# Patient Record
Sex: Female | Born: 1955 | Race: Black or African American | Hispanic: No | Marital: Married | Smoking: Former smoker
Health system: Southern US, Community
[De-identification: ages and names within clinical notes are randomized; demographics above are authoritative.]

## PROBLEM LIST (undated history)

## (undated) DIAGNOSIS — I10 Essential (primary) hypertension: Secondary | ICD-10-CM

## (undated) DIAGNOSIS — R809 Proteinuria, unspecified: Secondary | ICD-10-CM

## (undated) DIAGNOSIS — E785 Hyperlipidemia, unspecified: Secondary | ICD-10-CM

## (undated) DIAGNOSIS — E119 Type 2 diabetes mellitus without complications: Secondary | ICD-10-CM

## (undated) HISTORY — DX: Essential (primary) hypertension: I10

## (undated) HISTORY — DX: Type 2 diabetes mellitus without complications: E11.9

## (undated) HISTORY — DX: Hyperlipidemia, unspecified: E78.5

## (undated) HISTORY — DX: Proteinuria, unspecified: R80.9

---

## 2000-03-19 ENCOUNTER — Emergency Department (HOSPITAL_COMMUNITY): Admission: EM | Admit: 2000-03-19 | Discharge: 2000-03-19 | Payer: Self-pay | Admitting: Emergency Medicine

## 2002-03-15 ENCOUNTER — Other Ambulatory Visit: Admission: RE | Admit: 2002-03-15 | Discharge: 2002-03-15 | Payer: Self-pay | Admitting: Family Medicine

## 2003-02-09 ENCOUNTER — Encounter: Admission: RE | Admit: 2003-02-09 | Discharge: 2003-02-09 | Payer: Self-pay | Admitting: Family Medicine

## 2003-07-04 ENCOUNTER — Other Ambulatory Visit: Admission: RE | Admit: 2003-07-04 | Discharge: 2003-07-04 | Payer: Self-pay | Admitting: Family Medicine

## 2003-08-01 ENCOUNTER — Encounter: Admission: RE | Admit: 2003-08-01 | Discharge: 2003-09-19 | Payer: Self-pay | Admitting: Family Medicine

## 2004-12-31 ENCOUNTER — Other Ambulatory Visit: Admission: RE | Admit: 2004-12-31 | Discharge: 2004-12-31 | Payer: Self-pay | Admitting: Family Medicine

## 2007-01-06 ENCOUNTER — Other Ambulatory Visit: Admission: RE | Admit: 2007-01-06 | Discharge: 2007-01-06 | Payer: Self-pay | Admitting: Family Medicine

## 2009-08-14 ENCOUNTER — Other Ambulatory Visit: Admission: RE | Admit: 2009-08-14 | Discharge: 2009-08-14 | Payer: Self-pay | Admitting: Family Medicine

## 2012-06-22 ENCOUNTER — Other Ambulatory Visit: Payer: Self-pay | Admitting: Family Medicine

## 2012-06-22 ENCOUNTER — Other Ambulatory Visit (HOSPITAL_COMMUNITY)
Admission: RE | Admit: 2012-06-22 | Discharge: 2012-06-22 | Disposition: A | Discharge status: Home / Self Care | Payer: 59 | Source: Ambulatory Visit | Attending: Family Medicine | Admitting: Family Medicine

## 2012-06-22 DIAGNOSIS — Z124 Encounter for screening for malignant neoplasm of cervix: Secondary | ICD-10-CM | POA: Insufficient documentation

## 2012-12-02 ENCOUNTER — Ambulatory Visit: Payer: 59 | Admitting: *Deleted

## 2012-12-08 ENCOUNTER — Encounter: Payer: Self-pay | Admitting: *Deleted

## 2012-12-08 ENCOUNTER — Encounter: Payer: 59 | Attending: Family Medicine | Admitting: *Deleted

## 2012-12-08 VITALS — Ht 64.0 in | Wt 248.4 lb

## 2012-12-08 DIAGNOSIS — E119 Type 2 diabetes mellitus without complications: Secondary | ICD-10-CM

## 2012-12-08 DIAGNOSIS — Z713 Dietary counseling and surveillance: Secondary | ICD-10-CM | POA: Insufficient documentation

## 2012-12-08 NOTE — Progress Notes (Signed)
Appt start time: 1500 end time:  1600.   Insulin Instruction  Patient was seen on 12/08/12 for insulin instruction.  The following learning objectives were met by the patient during this visit:   Insulin Action of Levemir insulins  Reviewed syringe & vial VS pen including vial VS Pen cartridge and needles  Hygiene and storage  Drawing up single and mixed doses if using vials   Single dose   Mixed dose - NA   Rotation of Sites  Hypoglycemia- symptoms, causes , treatment choices  Record keeping and MD follow up  Hypoglycemia, causes, symptoms and treatment   Patient demonstrated understanding of insulin administration by return demonstration. Also provided information on:  Physiology of diabetes  SMBG, target ranges and trouble shooting tips, suggested he record her BG pre and post meal for MD review at next appt.  A1c  Carb Counting, label reading and rationale of spreading carb containing foods evenly throughout the day  Patient received the following handouts:  Insulin Instruction Handout Insulin Action handout Insulin Site and rotation handout                                       Plan: Patient to start on insulin as Rx'd by MD  Take 7 units Levemir at 8 PM each night. Increase dose by 2 units every 3-4 days until fasting BG is at 120 mg/dl, then stay at that dose as needed. Call MD if BG drops below 70 or rises above 300 mg/dl  Patient will be seen for follow-up as needed.

## 2012-12-10 ENCOUNTER — Encounter: Payer: Self-pay | Admitting: *Deleted

## 2012-12-10 NOTE — Patient Instructions (Signed)
Plan: Take 7 units Levemir at 8 PM each night. Increase dose by 2 units every 3-4 days until fasting BG is at 120 mg/dl, then stay at that dose as needed. Call MD if BG drops below 70 or rises above 300 mg/dl

## 2013-10-26 ENCOUNTER — Other Ambulatory Visit: Payer: Self-pay | Admitting: Family Medicine

## 2013-10-26 DIAGNOSIS — R921 Mammographic calcification found on diagnostic imaging of breast: Secondary | ICD-10-CM

## 2013-11-02 ENCOUNTER — Other Ambulatory Visit: Payer: 59

## 2013-11-05 ENCOUNTER — Encounter (INDEPENDENT_AMBULATORY_CARE_PROVIDER_SITE_OTHER): Payer: Self-pay

## 2013-11-05 ENCOUNTER — Ambulatory Visit
Admission: RE | Admit: 2013-11-05 | Discharge: 2013-11-05 | Disposition: A | Discharge status: Home / Self Care | Payer: 59 | Source: Ambulatory Visit | Attending: Family Medicine | Admitting: Family Medicine

## 2013-11-05 DIAGNOSIS — R921 Mammographic calcification found on diagnostic imaging of breast: Secondary | ICD-10-CM

## 2014-10-07 ENCOUNTER — Other Ambulatory Visit: Payer: Self-pay

## 2014-10-07 DIAGNOSIS — Z1231 Encounter for screening mammogram for malignant neoplasm of breast: Secondary | ICD-10-CM

## 2014-10-14 ENCOUNTER — Ambulatory Visit
Admission: RE | Admit: 2014-10-14 | Discharge: 2014-10-14 | Disposition: A | Discharge status: Home / Self Care | Payer: 59 | Source: Ambulatory Visit

## 2014-10-14 DIAGNOSIS — Z1231 Encounter for screening mammogram for malignant neoplasm of breast: Secondary | ICD-10-CM

## 2015-09-18 ENCOUNTER — Other Ambulatory Visit: Payer: Self-pay | Admitting: Family Medicine

## 2015-09-18 DIAGNOSIS — Z1231 Encounter for screening mammogram for malignant neoplasm of breast: Secondary | ICD-10-CM

## 2015-10-17 ENCOUNTER — Other Ambulatory Visit: Payer: Self-pay | Admitting: Family Medicine

## 2015-10-17 ENCOUNTER — Ambulatory Visit
Admission: RE | Admit: 2015-10-17 | Discharge: 2015-10-17 | Disposition: A | Discharge status: Home / Self Care | Payer: 59 | Source: Ambulatory Visit | Attending: Family Medicine | Admitting: Family Medicine

## 2015-10-17 ENCOUNTER — Other Ambulatory Visit (HOSPITAL_COMMUNITY)
Admission: RE | Admit: 2015-10-17 | Discharge: 2015-10-17 | Disposition: A | Discharge status: Home / Self Care | Payer: 59 | Source: Ambulatory Visit | Attending: Family Medicine | Admitting: Family Medicine

## 2015-10-17 DIAGNOSIS — Z124 Encounter for screening for malignant neoplasm of cervix: Secondary | ICD-10-CM | POA: Insufficient documentation

## 2015-10-17 DIAGNOSIS — Z1231 Encounter for screening mammogram for malignant neoplasm of breast: Secondary | ICD-10-CM

## 2015-10-19 LAB — CYTOLOGY - PAP

## 2016-09-13 ENCOUNTER — Other Ambulatory Visit: Payer: Self-pay | Admitting: Family Medicine

## 2016-09-13 DIAGNOSIS — Z1231 Encounter for screening mammogram for malignant neoplasm of breast: Secondary | ICD-10-CM

## 2016-11-15 ENCOUNTER — Ambulatory Visit
Admission: RE | Admit: 2016-11-15 | Discharge: 2016-11-15 | Disposition: A | Discharge status: Home / Self Care | Payer: 59 | Source: Ambulatory Visit | Attending: Family Medicine | Admitting: Family Medicine

## 2016-11-15 DIAGNOSIS — Z1231 Encounter for screening mammogram for malignant neoplasm of breast: Secondary | ICD-10-CM

## 2017-09-09 ENCOUNTER — Other Ambulatory Visit: Payer: Self-pay | Admitting: Family Medicine

## 2017-09-09 DIAGNOSIS — Z1231 Encounter for screening mammogram for malignant neoplasm of breast: Secondary | ICD-10-CM

## 2017-11-21 ENCOUNTER — Ambulatory Visit: Payer: 59

## 2017-11-24 ENCOUNTER — Other Ambulatory Visit (HOSPITAL_COMMUNITY): Payer: Self-pay | Admitting: *Deleted

## 2017-11-24 DIAGNOSIS — Z1231 Encounter for screening mammogram for malignant neoplasm of breast: Secondary | ICD-10-CM

## 2018-02-26 ENCOUNTER — Ambulatory Visit (HOSPITAL_COMMUNITY): Payer: Self-pay

## 2018-03-28 DIAGNOSIS — E119 Type 2 diabetes mellitus without complications: Secondary | ICD-10-CM | POA: Diagnosis not present

## 2018-03-28 DIAGNOSIS — J019 Acute sinusitis, unspecified: Secondary | ICD-10-CM | POA: Diagnosis not present

## 2018-04-13 DIAGNOSIS — J309 Allergic rhinitis, unspecified: Secondary | ICD-10-CM | POA: Diagnosis not present

## 2018-04-13 DIAGNOSIS — Z0000 Encounter for general adult medical examination without abnormal findings: Secondary | ICD-10-CM | POA: Diagnosis not present

## 2018-04-13 DIAGNOSIS — N181 Chronic kidney disease, stage 1: Secondary | ICD-10-CM | POA: Diagnosis not present

## 2018-04-13 DIAGNOSIS — E1121 Type 2 diabetes mellitus with diabetic nephropathy: Secondary | ICD-10-CM | POA: Diagnosis not present

## 2018-04-13 DIAGNOSIS — I129 Hypertensive chronic kidney disease with stage 1 through stage 4 chronic kidney disease, or unspecified chronic kidney disease: Secondary | ICD-10-CM | POA: Diagnosis not present

## 2018-09-05 DIAGNOSIS — H18212 Corneal edema secondary to contact lens, left eye: Secondary | ICD-10-CM | POA: Diagnosis not present

## 2018-10-12 DIAGNOSIS — N181 Chronic kidney disease, stage 1: Secondary | ICD-10-CM | POA: Diagnosis not present

## 2018-10-12 DIAGNOSIS — E7800 Pure hypercholesterolemia, unspecified: Secondary | ICD-10-CM | POA: Diagnosis not present

## 2018-10-12 DIAGNOSIS — I129 Hypertensive chronic kidney disease with stage 1 through stage 4 chronic kidney disease, or unspecified chronic kidney disease: Secondary | ICD-10-CM | POA: Diagnosis not present

## 2018-10-12 DIAGNOSIS — E1121 Type 2 diabetes mellitus with diabetic nephropathy: Secondary | ICD-10-CM | POA: Diagnosis not present

## 2018-12-14 DIAGNOSIS — Z1231 Encounter for screening mammogram for malignant neoplasm of breast: Secondary | ICD-10-CM | POA: Diagnosis not present

## 2019-04-22 ENCOUNTER — Other Ambulatory Visit: Payer: Self-pay | Admitting: Family Medicine

## 2019-04-22 ENCOUNTER — Other Ambulatory Visit (HOSPITAL_COMMUNITY)
Admission: RE | Admit: 2019-04-22 | Discharge: 2019-04-22 | Disposition: A | Discharge status: Home / Self Care | Payer: BC Managed Care – PPO | Source: Ambulatory Visit | Attending: Family Medicine | Admitting: Family Medicine

## 2019-04-22 DIAGNOSIS — Z124 Encounter for screening for malignant neoplasm of cervix: Secondary | ICD-10-CM | POA: Insufficient documentation

## 2019-04-22 DIAGNOSIS — Z0000 Encounter for general adult medical examination without abnormal findings: Secondary | ICD-10-CM | POA: Diagnosis not present

## 2019-04-22 DIAGNOSIS — I129 Hypertensive chronic kidney disease with stage 1 through stage 4 chronic kidney disease, or unspecified chronic kidney disease: Secondary | ICD-10-CM | POA: Diagnosis not present

## 2019-04-22 DIAGNOSIS — N181 Chronic kidney disease, stage 1: Secondary | ICD-10-CM | POA: Diagnosis not present

## 2019-04-22 DIAGNOSIS — E1121 Type 2 diabetes mellitus with diabetic nephropathy: Secondary | ICD-10-CM | POA: Diagnosis not present

## 2019-04-22 DIAGNOSIS — E7800 Pure hypercholesterolemia, unspecified: Secondary | ICD-10-CM | POA: Diagnosis not present

## 2019-04-22 DIAGNOSIS — H6123 Impacted cerumen, bilateral: Secondary | ICD-10-CM | POA: Diagnosis not present

## 2019-04-23 LAB — CYTOLOGY - PAP: Diagnosis: NEGATIVE

## 2019-05-11 DIAGNOSIS — M62830 Muscle spasm of back: Secondary | ICD-10-CM | POA: Diagnosis not present

## 2019-05-11 DIAGNOSIS — M545 Low back pain: Secondary | ICD-10-CM | POA: Diagnosis not present

## 2019-11-17 DIAGNOSIS — E1121 Type 2 diabetes mellitus with diabetic nephropathy: Secondary | ICD-10-CM | POA: Diagnosis not present

## 2019-11-17 DIAGNOSIS — N181 Chronic kidney disease, stage 1: Secondary | ICD-10-CM | POA: Diagnosis not present

## 2019-11-17 DIAGNOSIS — I129 Hypertensive chronic kidney disease with stage 1 through stage 4 chronic kidney disease, or unspecified chronic kidney disease: Secondary | ICD-10-CM | POA: Diagnosis not present

## 2019-11-17 DIAGNOSIS — E7800 Pure hypercholesterolemia, unspecified: Secondary | ICD-10-CM | POA: Diagnosis not present

## 2019-12-23 DIAGNOSIS — Z1231 Encounter for screening mammogram for malignant neoplasm of breast: Secondary | ICD-10-CM | POA: Diagnosis not present

## 2020-01-22 ENCOUNTER — Ambulatory Visit (HOSPITAL_COMMUNITY)
Admission: EM | Admit: 2020-01-22 | Discharge: 2020-01-22 | Disposition: A | Discharge status: Home / Self Care | Payer: BC Managed Care – PPO | Attending: Student | Admitting: Student

## 2020-01-22 ENCOUNTER — Encounter (HOSPITAL_COMMUNITY): Payer: Self-pay

## 2020-01-22 ENCOUNTER — Other Ambulatory Visit: Payer: Self-pay

## 2020-01-22 DIAGNOSIS — Z79899 Other long term (current) drug therapy: Secondary | ICD-10-CM | POA: Insufficient documentation

## 2020-01-22 DIAGNOSIS — R051 Acute cough: Secondary | ICD-10-CM | POA: Diagnosis not present

## 2020-01-22 DIAGNOSIS — Z87891 Personal history of nicotine dependence: Secondary | ICD-10-CM | POA: Diagnosis not present

## 2020-01-22 DIAGNOSIS — U071 COVID-19: Secondary | ICD-10-CM | POA: Diagnosis not present

## 2020-01-22 DIAGNOSIS — E119 Type 2 diabetes mellitus without complications: Secondary | ICD-10-CM | POA: Diagnosis not present

## 2020-01-22 DIAGNOSIS — I10 Essential (primary) hypertension: Secondary | ICD-10-CM | POA: Insufficient documentation

## 2020-01-22 DIAGNOSIS — J069 Acute upper respiratory infection, unspecified: Secondary | ICD-10-CM

## 2020-01-22 DIAGNOSIS — E785 Hyperlipidemia, unspecified: Secondary | ICD-10-CM | POA: Insufficient documentation

## 2020-01-22 DIAGNOSIS — Z794 Long term (current) use of insulin: Secondary | ICD-10-CM | POA: Diagnosis not present

## 2020-01-22 LAB — RESP PANEL BY RT-PCR (FLU A&B, COVID) ARPGX2
Influenza A by PCR: NEGATIVE
Influenza B by PCR: NEGATIVE
SARS Coronavirus 2 by RT PCR: POSITIVE — AB

## 2020-01-22 MED ORDER — BENZONATATE 100 MG PO CAPS: 100.0000 mg | ORAL_CAPSULE | Freq: Three times a day (TID) | ORAL | 0 refills | Status: AC

## 2020-01-22 MED ORDER — PROMETHAZINE-DM 6.25-15 MG/5ML PO SYRP: 5.0000 mL | ORAL_SOLUTION | Freq: Four times a day (QID) | ORAL | 0 refills | Status: AC | PRN

## 2020-01-22 MED ORDER — ALBUTEROL SULFATE HFA 108 (90 BASE) MCG/ACT IN AERS: 1.0000 | INHALATION_SPRAY | Freq: Four times a day (QID) | RESPIRATORY_TRACT | 0 refills | Status: AC | PRN

## 2020-01-22 NOTE — ED Triage Notes (Signed)
Pt presents with bilateral ear pain, body aches, scratchy throat, cough and fever 101.0 F x 2 days.

## 2020-01-22 NOTE — ED Provider Notes (Signed)
[facility]    CSN: [id number] Arrival date & time: 01/22/20  1308      History   Chief Complaint Chief Complaint  Patient presents with  . Cough  . Ear Pain  . Fever  . Generalized Body Aches    HPI Julie Cooke is a 65 y.o. female Presenting for URI symptoms for 2 days. History of diabetes, hyperlipidemia, hypertension, proteinuria. Endorses cough, ear pain, fevers up to 101.0, generalized body aches. States she typically gets sinus/ear pressure with colds and she's having that now. Hasn't been taking anything for symptoms. Endorses loss of taste. States she coughed so hard she threw up once this morning. 7/10 headache that's improved by ibuprofen. Denies n/d, shortness of breath, chest pain, facial pain, teeth pain, swollen lymph nodes. Denies chest pain, shortness of breath, confusion, high fevers.  Fully vaccinated for covid-19.  Denies worst headache of life, thunderclap headache, weakness/sensation changes in arms/legs, vision changes, shortness of breath, chest pain/pressure, photophobia, phonophobia, n/v/d.     HPI  Past Medical History:  Diagnosis Date  . Diabetes mellitus without complication (HCC)   . Hyperlipidemia   . Hypertension   . Proteinuria     There are no problems to display for this patient.   History reviewed. No pertinent surgical history.  OB History   No obstetric history on file.      Home Medications    Prior to Admission medications   Medication Sig Start Date End Date Taking? Authorizing Provider  albuterol (VENTOLIN HFA) 108 (90 Base) MCG/ACT inhaler Inhale 1-2 puffs into the lungs every 6 (six) hours as needed for wheezing or shortness of breath. 01/22/20  Yes Rhys Martini, PA-C  benzonatate (TESSALON) 100 MG capsule Take 1 capsule (100 mg total) by mouth every 8 (eight) hours. 01/22/20  Yes Rhys Martini, PA-C  promethazine-dextromethorphan (PROMETHAZINE-DM) 6.25-15 MG/5ML syrup Take 5 mLs by mouth 4 (four) times  daily as needed for cough. 01/22/20  Yes Rhys Martini, PA-C  benazepril (LOTENSIN) 20 MG tablet Take 20 mg by mouth daily.    [provider]  ferrous fumarate (FERRO-SEQUELS) 50 MG CR tablet Take 50 mg by mouth daily.    [provider]  hydrochlorothiazide (MICROZIDE) 12.5 MG capsule Take 12.5 mg by mouth daily.    [provider]  Insulin Detemir (LEVEMIR FLEXPEN) 100 UNIT/ML SOPN Inject 7 Units into the skin at bedtime.    [provider]  metFORMIN (GLUCOPHAGE-XR) 500 MG 24 hr tablet Take 500 mg by mouth 2 (two) times daily after a meal.    [provider]  Multiple Vitamin (MULTIVITAMIN) tablet Take 1 tablet by mouth daily.    [provider]    Family History History reviewed. No pertinent family history.  Social History Social History   Tobacco Use  . Smoking status: Former Smoker    Quit date: 12/09/1998    Years since quitting: 21.1  . Smokeless tobacco: Never Used  Substance Use Topics  . Alcohol use: No     Allergies   Patient has no known allergies.   Review of Systems Review of Systems  Constitutional: Positive for chills and fever. Negative for appetite change.  HENT: Positive for sinus pressure. Negative for congestion, ear pain, rhinorrhea, sinus pain and sore throat.   Eyes: Negative for redness and visual disturbance.  Respiratory: Positive for cough. Negative for chest tightness, shortness of breath and wheezing.   Cardiovascular: Negative for chest pain and palpitations.  Gastrointestinal: Negative for abdominal pain, constipation, diarrhea, nausea and vomiting.  Genitourinary: Negative for dysuria, frequency and urgency.  Musculoskeletal: Negative for myalgias.  Neurological: Positive for headaches. Negative for dizziness and weakness.  Psychiatric/Behavioral: Negative for confusion.  All other systems reviewed and are negative.    Physical Exam Triage Vital Signs ED Triage Vitals  Enc Vitals  Group     BP 01/22/20 1529 (!) 146/62     Pulse Rate 01/22/20 1529 84     Resp 01/22/20 1529 18     Temp 01/22/20 1529 98.1 F (36.7 C)     Temp Source 01/22/20 1529 Oral     SpO2 01/22/20 1529 99 %     Weight --      Height --      Head Circumference --      Peak Flow --      Pain Score 01/22/20 1528 5     Pain Loc --      Pain Edu? --      Excl. in GC? --    No data found.  Updated Vital Signs BP (!) 146/62 (BP Location: Right Arm)   Pulse 84   Temp 98.1 F (36.7 C) (Oral)   Resp 18   SpO2 99%   Visual Acuity Right Eye Distance:   Left Eye Distance:   Bilateral Distance:    Right Eye Near:   Left Eye Near:    Bilateral Near:     Physical Exam Vitals reviewed.  Constitutional:      General: She is not in acute distress.    Appearance: Normal appearance. She is not ill-appearing.  HENT:     Head: Normocephalic and atraumatic.     Right Ear: Hearing, tympanic membrane, ear canal and external ear normal. No swelling or tenderness. There is no impacted cerumen. No mastoid tenderness. Tympanic membrane is not perforated, erythematous, retracted or bulging.     Left Ear: Hearing, tympanic membrane, ear canal and external ear normal. No swelling or tenderness. There is no impacted cerumen. No mastoid tenderness. Tympanic membrane is not perforated, erythematous, retracted or bulging.     Nose:     Right Sinus: No maxillary sinus tenderness or frontal sinus tenderness.     Left Sinus: No maxillary sinus tenderness or frontal sinus tenderness.     Mouth/Throat:     Mouth: Mucous membranes are moist.     Pharynx: Uvula midline. No oropharyngeal exudate or posterior oropharyngeal erythema.     Tonsils: No tonsillar exudate.  Cardiovascular:     Rate and Rhythm: Normal rate and regular rhythm.     Heart sounds: Normal heart sounds.  Pulmonary:     Breath sounds: Normal breath sounds and air entry. No wheezing, rhonchi or rales.     Comments: Frequent coughs Chest:      Chest wall: No tenderness.  Abdominal:     General: Abdomen is flat. Bowel sounds are normal.     Tenderness: There is no abdominal tenderness. There is no guarding or rebound.  Lymphadenopathy:     Cervical: No cervical adenopathy.  Neurological:     General: No focal deficit present.     Mental Status: She is alert and oriented to person, place, and time.  Psychiatric:        Attention and Perception: Attention and perception normal.        Mood and Affect: Mood and affect normal.        Behavior: Behavior normal. Behavior is  cooperative.        Thought Content: Thought content normal.        Judgment: Judgment normal.      UC Treatments / Results  Labs (all labs ordered are listed, but only abnormal results are displayed) Labs Reviewed  RESP PANEL BY RT-PCR (FLU A&B, COVID) [id number]    EKG   Radiology No results found.  Procedures Procedures (including critical care time)  Medications Ordered in UC Medications - No data to display  Initial Impression / Assessment and Plan / UC Course  I have reviewed the triage vital signs and the nursing notes.  Pertinent labs & imaging results that were available during my care of the patient were reviewed by me and considered in my medical decision making (see chart for details).     Covid and influenza tests sent today- positive for covid. Patient is fully vaccinated for covid-19. Isolation precautions per CDC guidelines.. Symptomatic relief with OTC Mucinex, Nyquil, etc. Return precautions- new/worsening fevers/chills, shortness of breath, chest pain, abd pain, etc.   Albuterol, tessalon, promethazine DM as below.     Final Clinical Impressions(s) / UC Diagnoses   Final diagnoses:  Viral upper respiratory tract infection   Discharge Instructions   None    ED Prescriptions    Medication Sig Dispense Auth. Provider   benzonatate (TESSALON) 100 MG capsule Take 1 capsule (100 mg total) by mouth every 8 (eight) hours. 21  capsule Hazel Sams, PA-C   albuterol (VENTOLIN HFA) 108 (90 Base) MCG/ACT inhaler Inhale 1-2 puffs into the lungs every 6 (six) hours as needed for wheezing or shortness of breath. 1 each Hazel Sams, PA-C   promethazine-dextromethorphan (PROMETHAZINE-DM) 6.25-15 MG/5ML syrup Take 5 mLs by mouth 4 (four) times daily as needed for cough. 118 mL Hazel Sams, PA-C     PDMP not reviewed this encounter.   Hazel Sams, PA-C 01/23/20 [phone]

## 2020-03-29 DIAGNOSIS — N181 Chronic kidney disease, stage 1: Secondary | ICD-10-CM | POA: Diagnosis not present

## 2020-03-29 DIAGNOSIS — E1121 Type 2 diabetes mellitus with diabetic nephropathy: Secondary | ICD-10-CM | POA: Diagnosis not present

## 2020-03-29 DIAGNOSIS — E7800 Pure hypercholesterolemia, unspecified: Secondary | ICD-10-CM | POA: Diagnosis not present

## 2020-03-29 DIAGNOSIS — I129 Hypertensive chronic kidney disease with stage 1 through stage 4 chronic kidney disease, or unspecified chronic kidney disease: Secondary | ICD-10-CM | POA: Diagnosis not present

## 2020-04-28 DIAGNOSIS — E7800 Pure hypercholesterolemia, unspecified: Secondary | ICD-10-CM | POA: Diagnosis not present

## 2020-04-28 DIAGNOSIS — Z0000 Encounter for general adult medical examination without abnormal findings: Secondary | ICD-10-CM | POA: Diagnosis not present

## 2020-04-28 DIAGNOSIS — E1121 Type 2 diabetes mellitus with diabetic nephropathy: Secondary | ICD-10-CM | POA: Diagnosis not present

## 2020-04-28 DIAGNOSIS — I129 Hypertensive chronic kidney disease with stage 1 through stage 4 chronic kidney disease, or unspecified chronic kidney disease: Secondary | ICD-10-CM | POA: Diagnosis not present

## 2020-04-28 DIAGNOSIS — N181 Chronic kidney disease, stage 1: Secondary | ICD-10-CM | POA: Diagnosis not present

## 2020-05-03 DIAGNOSIS — Z780 Asymptomatic menopausal state: Secondary | ICD-10-CM | POA: Diagnosis not present

## 2020-09-18 DIAGNOSIS — L309 Dermatitis, unspecified: Secondary | ICD-10-CM | POA: Diagnosis not present

## 2020-10-04 DIAGNOSIS — R21 Rash and other nonspecific skin eruption: Secondary | ICD-10-CM | POA: Diagnosis not present

## 2020-11-01 DIAGNOSIS — E1121 Type 2 diabetes mellitus with diabetic nephropathy: Secondary | ICD-10-CM | POA: Diagnosis not present

## 2020-11-01 DIAGNOSIS — N181 Chronic kidney disease, stage 1: Secondary | ICD-10-CM | POA: Diagnosis not present

## 2020-11-01 DIAGNOSIS — I129 Hypertensive chronic kidney disease with stage 1 through stage 4 chronic kidney disease, or unspecified chronic kidney disease: Secondary | ICD-10-CM | POA: Diagnosis not present

## 2020-11-01 DIAGNOSIS — E7800 Pure hypercholesterolemia, unspecified: Secondary | ICD-10-CM | POA: Diagnosis not present

## 2020-12-08 ENCOUNTER — Encounter (HOSPITAL_COMMUNITY): Payer: Self-pay | Admitting: Emergency Medicine

## 2020-12-08 ENCOUNTER — Emergency Department (HOSPITAL_COMMUNITY)
Admission: EM | Admit: 2020-12-08 | Discharge: 2020-12-08 | Disposition: A | Discharge status: Home / Self Care | Payer: BC Managed Care – PPO | Attending: Emergency Medicine | Admitting: Emergency Medicine

## 2020-12-08 ENCOUNTER — Emergency Department (HOSPITAL_COMMUNITY): Payer: BC Managed Care – PPO

## 2020-12-08 ENCOUNTER — Other Ambulatory Visit: Payer: Self-pay

## 2020-12-08 DIAGNOSIS — R079 Chest pain, unspecified: Secondary | ICD-10-CM

## 2020-12-08 DIAGNOSIS — E119 Type 2 diabetes mellitus without complications: Secondary | ICD-10-CM | POA: Insufficient documentation

## 2020-12-08 DIAGNOSIS — R0789 Other chest pain: Secondary | ICD-10-CM | POA: Diagnosis not present

## 2020-12-08 DIAGNOSIS — Z794 Long term (current) use of insulin: Secondary | ICD-10-CM | POA: Insufficient documentation

## 2020-12-08 DIAGNOSIS — R9431 Abnormal electrocardiogram [ECG] [EKG]: Secondary | ICD-10-CM | POA: Diagnosis not present

## 2020-12-08 DIAGNOSIS — J9811 Atelectasis: Secondary | ICD-10-CM | POA: Diagnosis not present

## 2020-12-08 DIAGNOSIS — Z87891 Personal history of nicotine dependence: Secondary | ICD-10-CM | POA: Insufficient documentation

## 2020-12-08 DIAGNOSIS — Z79899 Other long term (current) drug therapy: Secondary | ICD-10-CM | POA: Insufficient documentation

## 2020-12-08 DIAGNOSIS — I10 Essential (primary) hypertension: Secondary | ICD-10-CM | POA: Diagnosis not present

## 2020-12-08 DIAGNOSIS — Z7984 Long term (current) use of oral hypoglycemic drugs: Secondary | ICD-10-CM | POA: Diagnosis not present

## 2020-12-08 DIAGNOSIS — R030 Elevated blood-pressure reading, without diagnosis of hypertension: Secondary | ICD-10-CM | POA: Diagnosis not present

## 2020-12-08 LAB — COMPREHENSIVE METABOLIC PANEL
ALT: 16 U/L (ref 0–44)
AST: 21 U/L (ref 15–41)
Albumin: 3.5 g/dL (ref 3.5–5.0)
Alkaline Phosphatase: 70 U/L (ref 38–126)
Anion gap: 8 (ref 5–15)
BUN: 10 mg/dL (ref 8–23)
CO2: 26 mmol/L (ref 22–32)
Calcium: 9.3 mg/dL (ref 8.9–10.3)
Chloride: 105 mmol/L (ref 98–111)
Creatinine, Ser: 0.73 mg/dL (ref 0.44–1.00)
GFR, Estimated: >60 mL/min (ref >60)
Glucose, Bld: 75 mg/dL (ref 70–99)
Potassium: 3.6 mmol/L (ref 3.5–5.1)
Sodium: 139 mmol/L (ref 135–145)
Total Bilirubin: 0.5 mg/dL (ref 0.3–1.2)
Total Protein: 8.2 g/dL — ABNORMAL HIGH (ref 6.5–8.1)

## 2020-12-08 LAB — CBC WITH DIFFERENTIAL/PLATELET
Abs Immature Granulocytes: 0 10*3/uL (ref 0.00–0.07)
Basophils Absolute: 0.2 10*3/uL — ABNORMAL HIGH (ref 0.0–0.1)
Basophils Relative: 2 %
Eosinophils Absolute: 0 10*3/uL (ref 0.0–0.5)
Eosinophils Relative: 0 %
HCT: 35.1 % — ABNORMAL LOW (ref 36.0–46.0)
Hemoglobin: 11.4 g/dL — ABNORMAL LOW (ref 12.0–15.0)
Lymphocytes Relative: 44 %
Lymphs Abs: 3.3 10*3/uL (ref 0.7–4.0)
MCH: 28.6 pg (ref 26.0–34.0)
MCHC: 32.5 g/dL (ref 30.0–36.0)
MCV: 88 fL (ref 80.0–100.0)
Monocytes Absolute: 0.8 10*3/uL (ref 0.1–1.0)
Monocytes Relative: 10 %
Neutro Abs: 3.3 10*3/uL (ref 1.7–7.7)
Neutrophils Relative %: 44 %
Platelets: 289 10*3/uL (ref 150–400)
RBC: 3.99 MIL/uL (ref 3.87–5.11)
RDW: 14.1 % (ref 11.5–15.5)
WBC: 7.5 10*3/uL (ref 4.0–10.5)
nRBC: 0 % (ref 0.0–0.2)
nRBC: 0 /100 WBC

## 2020-12-08 LAB — TROPONIN I (HIGH SENSITIVITY)
Troponin I (High Sensitivity): 9 ng/L (ref <18)
Troponin I (High Sensitivity): 6 ng/L (ref <18)

## 2020-12-08 NOTE — ED Provider Notes (Signed)
[facility] EMERGENCY DEPARTMENT Provider Note   CSN: [id number] Arrival date & time: 12/08/20  1314     History Chief Complaint  Patient presents with   Chest Pain    Julie Cooke is a 65 y.o. female.   Chest Pain Associated symptoms: no abdominal pain, no back pain, no shortness of breath and no weakness      Patient presents with left-sided chest pain.  Started in left arm yesterday.  Now more in the anterior chest.  Not exertional.  Worse with certain movements.  Worse with palpation.  Worse with a deep breath.  Does not feel short of breath.  She is diabetic.  High cholesterol hypertension.  No previous known cardiac history.  Remote smoker but quit 22 years ago.  No family history of early cardiac disease.  Pain is described as sharp.  Has been going constantly today.  No swelling in her legs.  No abdominal pain.  No diaphoresis.  Past Medical History:  Diagnosis Date   Diabetes mellitus without complication (HCC)    Hyperlipidemia    Hypertension    Proteinuria     There are no problems to display for this patient.   History reviewed. No pertinent surgical history.   OB History   No obstetric history on file.     No family history on file.  Social History   Tobacco Use   Smoking status: Former    Types: Cigarettes    Quit date: 12/09/1998    Years since quitting: 22.0   Smokeless tobacco: Never  Substance Use Topics   Alcohol use: No    Home Medications Prior to Admission medications   Medication Sig Start Date End Date Taking? Authorizing Provider  albuterol (VENTOLIN HFA) 108 (90 Base) MCG/ACT inhaler Inhale 1-2 puffs into the lungs every 6 (six) hours as needed for wheezing or shortness of breath. 01/22/20  Yes Rhys Martini, PA-C  benazepril (LOTENSIN) 20 MG tablet Take 20 mg by mouth daily.   Yes [provider]  ferrous fumarate (FERRO-SEQUELS) 50 MG CR tablet Take 50 mg by mouth daily.   Yes [provider]  hydrochlorothiazide (MICROZIDE) 12.5 MG capsule Take 12.5 mg by mouth daily.   Yes [provider]  insulin detemir (LEVEMIR) 100 UNIT/ML FlexPen Inject 54 Units into the skin at bedtime.   Yes [provider]  metFORMIN (GLUCOPHAGE-XR) 500 MG 24 hr tablet Take 500 mg by mouth 2 (two) times daily after a meal.   Yes [provider]  benzonatate (TESSALON) 100 MG capsule Take 1 capsule (100 mg total) by mouth every 8 (eight) hours. Patient not taking: Reported on 12/08/2020 01/22/20   Rhys Martini, PA-C  promethazine-dextromethorphan (PROMETHAZINE-DM) 6.25-15 MG/5ML syrup Take 5 mLs by mouth 4 (four) times daily as needed for cough. Patient not taking: Reported on 12/08/2020 01/22/20   Rhys Martini, PA-C    Allergies    Patient has no known allergies.  Review of Systems   Review of Systems  Constitutional:  Negative for appetite change.  HENT:  Negative for congestion.   Respiratory:  Negative for shortness of breath.   Cardiovascular:  Positive for chest pain.  Gastrointestinal:  Negative for abdominal pain.  Genitourinary:  Negative for enuresis.  Musculoskeletal:  Negative for back pain.  Skin:  Negative for rash.  Neurological:  Negative for weakness.  Psychiatric/Behavioral:  Negative for confusion.    Physical Exam Updated Vital Signs BP (!) 154/50  Pulse 63   Temp 97.9 F (36.6 C) (Oral)   Resp 18   Ht 5\' 4"  (1.626 m)   Wt 104.3 kg   SpO2 99%   BMI 39.48 kg/m   Physical Exam Vitals and nursing note reviewed.  Constitutional:      Appearance: She is well-developed.  Cardiovascular:     Rate and Rhythm: Normal rate and regular rhythm.     Heart sounds: No murmur heard. Chest:     Chest wall: Tenderness present.     Comments: Tenderness to left anterior lateral chest wall near shoulder.  No deformity.  No rash. Musculoskeletal:     Right lower leg: No tenderness. No edema.     Left lower leg: No tenderness. No edema.   Skin:    General: Skin is warm.     Capillary Refill: Capillary refill takes less than 2 seconds.  Neurological:     Mental Status: She is alert and oriented to person, place, and time.    ED Results / Procedures / Treatments   Labs (all labs ordered are listed, but only abnormal results are displayed) Labs Reviewed  COMPREHENSIVE METABOLIC PANEL - Abnormal; Notable for the following components:      Result Value   Total Protein 8.2 (*)    All other components within normal limits  CBC WITH DIFFERENTIAL/PLATELET - Abnormal; Notable for the following components:   Hemoglobin 11.4 (*)    HCT 35.1 (*)    Basophils Absolute 0.2 (*)    All other components within normal limits  TROPONIN I (HIGH SENSITIVITY)  TROPONIN I (HIGH SENSITIVITY)    EKG EKG Interpretation  Date/Time:  Friday December 08 2020 13:20:36 EST Ventricular Rate:  67 PR Interval:  180 QRS Duration: 76 QT Interval:  410 QTC Calculation: 433 R Axis:   63 Text Interpretation: Normal sinus rhythm Septal infarct , age undetermined T wave abnormality, consider inferolateral ischemia Abnormal ECG No old tracing to compare Confirmed by Aletta Edouard [id number]) on 12/08/2020 1:21:49 PM  Radiology DG Chest 2 View  Result Date: 12/08/2020 CLINICAL DATA:  Chest pain EXAM: CHEST - 2 VIEW COMPARISON:  None. FINDINGS: Heart size upper normal. Negative for heart failure. No infiltrate effusion or edema. Mild linear scarring or atelectasis in the lung bases. IMPRESSION: Mild bibasilar atelectasis scarring. Negative for infiltrate or effusion. Electronically Signed   By: Franchot Gallo M.D.   On: 12/08/2020 15:10    Procedures Procedures   Medications Ordered in ED Medications - No data to display  ED Course  I have reviewed the triage vital signs and the nursing notes.  Pertinent labs & imaging results that were available during my care of the patient were reviewed by me and considered in my medical decision making (see  chart for details).    MDM Rules/Calculators/A&P                      Patient with chest pain.  EKG shows inferior and lateral T wave inversion although we do not know what she looks like as the baseline.  Pain is been going on and off over the week.  Troponin negative x2.  Pain is reproducible with palpation.  Heart pathway score however is 4.  Troponin negative x2.  Discussed with patient we will discharge home.  Follow-up with cardiology. Final Clinical Impression(s) / ED Diagnoses Final diagnoses:  Nonspecific chest pain    Rx / DC Orders ED Discharge Orders  None        Davonna Belling, MD 12/09/20 [phone]

## 2020-12-08 NOTE — ED Triage Notes (Signed)
Patient BIB [facility] from [facility]. Complaint of cp, shortness of breath x2 days. Pt received aspirin at [facility].

## 2020-12-08 NOTE — ED Provider Notes (Signed)
Emergency Medicine Provider Triage Evaluation Note  Julie Cooke , a 65 y.o. female  was evaluated in triage.  Pt complains of chest pain.  Chest pain has been intermittent over the last week.  Patient reports that pain is located to her left chest and radiates to her left arm.  Pain became more intense today at approximately 10:00am.  Pain has continued to be intermittent.  Patient denies any aggravating factors.   Review of Systems  Positive: Chest pain Negative: Nausea, vomiting, diaphoresis, shortness of breath, palpitations, leg swelling or tenderness, hemoptysis  Physical Exam  BP (!) 182/76 (BP Location: Right Arm)   Pulse 70   Temp 97.9 F (36.6 C) (Oral)   Resp 16   Ht 5\' 4"  (1.626 m)   Wt 104.3 kg   SpO2 98%   BMI 39.48 kg/m  Gen:   Awake, no distress   Resp:  Normal effort, lungs clear to auscultation bilaterally MSK:   Moves extremities without difficulty  Other:  +2 radial pulse bilaterally.  Abdomen soft, nondistended, nontender.  Medical Decision Making  Medically screening exam initiated at 2:40 PM.  Appropriate orders placed.  Julie Cooke was informed that the remainder of the evaluation will be completed by another provider, this initial triage assessment does not replace that evaluation, and the importance of remaining in the ED until their evaluation is complete.     Kerin Salen, PA-C 12/08/20 1441    12/10/20, MD 12/08/20 [phone]

## 2020-12-20 DIAGNOSIS — L299 Pruritus, unspecified: Secondary | ICD-10-CM | POA: Diagnosis not present

## 2020-12-20 DIAGNOSIS — R21 Rash and other nonspecific skin eruption: Secondary | ICD-10-CM | POA: Diagnosis not present

## 2020-12-25 DIAGNOSIS — B029 Zoster without complications: Secondary | ICD-10-CM | POA: Diagnosis not present

## 2020-12-25 DIAGNOSIS — R21 Rash and other nonspecific skin eruption: Secondary | ICD-10-CM | POA: Diagnosis not present

## 2021-01-02 DIAGNOSIS — L309 Dermatitis, unspecified: Secondary | ICD-10-CM | POA: Diagnosis not present

## 2021-01-02 DIAGNOSIS — L259 Unspecified contact dermatitis, unspecified cause: Secondary | ICD-10-CM | POA: Diagnosis not present

## 2021-01-02 DIAGNOSIS — D485 Neoplasm of uncertain behavior of skin: Secondary | ICD-10-CM | POA: Diagnosis not present

## 2021-01-10 DIAGNOSIS — L2084 Intrinsic (allergic) eczema: Secondary | ICD-10-CM | POA: Diagnosis not present

## 2021-01-23 DIAGNOSIS — Z1211 Encounter for screening for malignant neoplasm of colon: Secondary | ICD-10-CM | POA: Diagnosis not present

## 2021-02-10 DIAGNOSIS — M25512 Pain in left shoulder: Secondary | ICD-10-CM | POA: Diagnosis not present

## 2021-02-10 DIAGNOSIS — M7522 Bicipital tendinitis, left shoulder: Secondary | ICD-10-CM | POA: Diagnosis not present

## 2021-02-15 DIAGNOSIS — Z03818 Encounter for observation for suspected exposure to other biological agents ruled out: Secondary | ICD-10-CM | POA: Diagnosis not present

## 2021-02-15 DIAGNOSIS — Z20822 Contact with and (suspected) exposure to covid-19: Secondary | ICD-10-CM | POA: Diagnosis not present

## 2021-02-28 DIAGNOSIS — L281 Prurigo nodularis: Secondary | ICD-10-CM | POA: Diagnosis not present

## 2021-05-16 DIAGNOSIS — L2089 Other atopic dermatitis: Secondary | ICD-10-CM | POA: Diagnosis not present

## 2021-05-16 DIAGNOSIS — L281 Prurigo nodularis: Secondary | ICD-10-CM | POA: Diagnosis not present

## 2021-05-16 DIAGNOSIS — R21 Rash and other nonspecific skin eruption: Secondary | ICD-10-CM | POA: Diagnosis not present

## 2021-05-16 DIAGNOSIS — D179 Benign lipomatous neoplasm, unspecified: Secondary | ICD-10-CM | POA: Diagnosis not present

## 2021-05-16 DIAGNOSIS — Z0000 Encounter for general adult medical examination without abnormal findings: Secondary | ICD-10-CM | POA: Diagnosis not present

## 2021-05-16 DIAGNOSIS — L249 Irritant contact dermatitis, unspecified cause: Secondary | ICD-10-CM | POA: Diagnosis not present

## 2021-05-16 DIAGNOSIS — E1121 Type 2 diabetes mellitus with diabetic nephropathy: Secondary | ICD-10-CM | POA: Diagnosis not present

## 2021-05-16 DIAGNOSIS — I129 Hypertensive chronic kidney disease with stage 1 through stage 4 chronic kidney disease, or unspecified chronic kidney disease: Secondary | ICD-10-CM | POA: Diagnosis not present

## 2021-06-20 DIAGNOSIS — L2089 Other atopic dermatitis: Secondary | ICD-10-CM | POA: Diagnosis not present

## 2021-06-20 DIAGNOSIS — L281 Prurigo nodularis: Secondary | ICD-10-CM | POA: Diagnosis not present

## 2021-06-20 DIAGNOSIS — L249 Irritant contact dermatitis, unspecified cause: Secondary | ICD-10-CM | POA: Diagnosis not present

## 2021-06-25 DIAGNOSIS — J0100 Acute maxillary sinusitis, unspecified: Secondary | ICD-10-CM | POA: Diagnosis not present

## 2021-06-25 DIAGNOSIS — R051 Acute cough: Secondary | ICD-10-CM | POA: Diagnosis not present

## 2021-07-04 DIAGNOSIS — L2089 Other atopic dermatitis: Secondary | ICD-10-CM | POA: Diagnosis not present

## 2021-07-05 DIAGNOSIS — R591 Generalized enlarged lymph nodes: Secondary | ICD-10-CM | POA: Diagnosis not present

## 2021-07-05 DIAGNOSIS — R059 Cough, unspecified: Secondary | ICD-10-CM | POA: Diagnosis not present

## 2021-07-05 DIAGNOSIS — J209 Acute bronchitis, unspecified: Secondary | ICD-10-CM | POA: Diagnosis not present

## 2021-07-05 DIAGNOSIS — R634 Abnormal weight loss: Secondary | ICD-10-CM | POA: Diagnosis not present

## 2021-07-06 ENCOUNTER — Other Ambulatory Visit: Payer: Self-pay | Admitting: Physician Assistant

## 2021-07-06 DIAGNOSIS — R059 Cough, unspecified: Secondary | ICD-10-CM

## 2021-07-06 DIAGNOSIS — R591 Generalized enlarged lymph nodes: Secondary | ICD-10-CM | POA: Diagnosis not present

## 2021-07-06 DIAGNOSIS — D72829 Elevated white blood cell count, unspecified: Secondary | ICD-10-CM | POA: Diagnosis not present

## 2021-07-06 DIAGNOSIS — R634 Abnormal weight loss: Secondary | ICD-10-CM | POA: Diagnosis not present

## 2021-08-15 DIAGNOSIS — R222 Localized swelling, mass and lump, trunk: Secondary | ICD-10-CM | POA: Diagnosis not present

## 2021-08-16 DIAGNOSIS — Z1231 Encounter for screening mammogram for malignant neoplasm of breast: Secondary | ICD-10-CM | POA: Diagnosis not present

## 2021-08-22 ENCOUNTER — Other Ambulatory Visit: Payer: Self-pay | Admitting: Surgery

## 2021-08-22 DIAGNOSIS — R222 Localized swelling, mass and lump, trunk: Secondary | ICD-10-CM

## 2021-08-29 DIAGNOSIS — R590 Localized enlarged lymph nodes: Secondary | ICD-10-CM | POA: Diagnosis not present

## 2021-08-29 DIAGNOSIS — L2089 Other atopic dermatitis: Secondary | ICD-10-CM | POA: Diagnosis not present

## 2021-09-05 ENCOUNTER — Ambulatory Visit
Admission: RE | Admit: 2021-09-05 | Discharge: 2021-09-05 | Disposition: A | Discharge status: Home / Self Care | Payer: Self-pay | Source: Ambulatory Visit | Attending: Surgery | Admitting: Surgery

## 2021-09-05 DIAGNOSIS — D47Z9 Other specified neoplasms of uncertain behavior of lymphoid, hematopoietic and related tissue: Secondary | ICD-10-CM | POA: Diagnosis not present

## 2021-09-05 DIAGNOSIS — R222 Localized swelling, mass and lump, trunk: Secondary | ICD-10-CM

## 2021-09-05 DIAGNOSIS — R590 Localized enlarged lymph nodes: Secondary | ICD-10-CM | POA: Diagnosis not present

## 2021-09-05 DIAGNOSIS — D479 Neoplasm of uncertain behavior of lymphoid, hematopoietic and related tissue, unspecified: Secondary | ICD-10-CM | POA: Diagnosis not present

## 2021-09-05 DIAGNOSIS — R599 Enlarged lymph nodes, unspecified: Secondary | ICD-10-CM | POA: Diagnosis not present

## 2021-09-14 DIAGNOSIS — Z79899 Other long term (current) drug therapy: Secondary | ICD-10-CM | POA: Diagnosis not present

## 2021-09-14 DIAGNOSIS — E119 Type 2 diabetes mellitus without complications: Secondary | ICD-10-CM | POA: Diagnosis not present

## 2021-09-14 DIAGNOSIS — D47Z9 Other specified neoplasms of uncertain behavior of lymphoid, hematopoietic and related tissue: Secondary | ICD-10-CM | POA: Diagnosis not present

## 2021-09-14 DIAGNOSIS — D479 Neoplasm of uncertain behavior of lymphoid, hematopoietic and related tissue, unspecified: Secondary | ICD-10-CM | POA: Diagnosis not present

## 2021-09-14 DIAGNOSIS — I10 Essential (primary) hypertension: Secondary | ICD-10-CM | POA: Diagnosis not present

## 2021-09-14 DIAGNOSIS — C8590 Non-Hodgkin lymphoma, unspecified, unspecified site: Secondary | ICD-10-CM | POA: Diagnosis not present

## 2021-09-19 DIAGNOSIS — D47Z9 Other specified neoplasms of uncertain behavior of lymphoid, hematopoietic and related tissue: Secondary | ICD-10-CM | POA: Diagnosis not present

## 2021-09-19 DIAGNOSIS — D739 Disease of spleen, unspecified: Secondary | ICD-10-CM | POA: Diagnosis not present

## 2021-09-19 DIAGNOSIS — E049 Nontoxic goiter, unspecified: Secondary | ICD-10-CM | POA: Diagnosis not present

## 2021-09-19 DIAGNOSIS — C8318 Mantle cell lymphoma, lymph nodes of multiple sites: Secondary | ICD-10-CM | POA: Diagnosis not present

## 2021-09-27 DIAGNOSIS — Z862 Personal history of diseases of the blood and blood-forming organs and certain disorders involving the immune mechanism: Secondary | ICD-10-CM | POA: Diagnosis not present

## 2021-09-27 DIAGNOSIS — E790 Hyperuricemia without signs of inflammatory arthritis and tophaceous disease: Secondary | ICD-10-CM | POA: Diagnosis not present

## 2021-09-27 DIAGNOSIS — E509 Vitamin A deficiency, unspecified: Secondary | ICD-10-CM | POA: Diagnosis not present

## 2021-09-27 DIAGNOSIS — D519 Vitamin B12 deficiency anemia, unspecified: Secondary | ICD-10-CM | POA: Diagnosis not present

## 2021-09-27 DIAGNOSIS — R7402 Elevation of levels of lactic acid dehydrogenase (LDH): Secondary | ICD-10-CM | POA: Diagnosis not present

## 2021-09-27 DIAGNOSIS — D472 Monoclonal gammopathy: Secondary | ICD-10-CM | POA: Diagnosis not present

## 2021-09-27 DIAGNOSIS — C9110 Chronic lymphocytic leukemia of B-cell type not having achieved remission: Secondary | ICD-10-CM | POA: Diagnosis not present

## 2021-10-02 DIAGNOSIS — R599 Enlarged lymph nodes, unspecified: Secondary | ICD-10-CM | POA: Diagnosis not present

## 2021-10-03 DIAGNOSIS — R778 Other specified abnormalities of plasma proteins: Secondary | ICD-10-CM | POA: Diagnosis not present

## 2021-10-03 DIAGNOSIS — D47Z9 Other specified neoplasms of uncertain behavior of lymphoid, hematopoietic and related tissue: Secondary | ICD-10-CM | POA: Diagnosis not present

## 2021-10-03 DIAGNOSIS — R768 Other specified abnormal immunological findings in serum: Secondary | ICD-10-CM | POA: Diagnosis not present

## 2021-10-04 DIAGNOSIS — R599 Enlarged lymph nodes, unspecified: Secondary | ICD-10-CM | POA: Diagnosis not present

## 2021-10-15 DIAGNOSIS — R778 Other specified abnormalities of plasma proteins: Secondary | ICD-10-CM | POA: Diagnosis not present

## 2021-10-15 DIAGNOSIS — R768 Other specified abnormal immunological findings in serum: Secondary | ICD-10-CM | POA: Diagnosis not present

## 2021-10-15 DIAGNOSIS — D47Z9 Other specified neoplasms of uncertain behavior of lymphoid, hematopoietic and related tissue: Secondary | ICD-10-CM | POA: Diagnosis not present

## 2021-10-18 DIAGNOSIS — R778 Other specified abnormalities of plasma proteins: Secondary | ICD-10-CM | POA: Diagnosis not present

## 2021-10-18 DIAGNOSIS — C9110 Chronic lymphocytic leukemia of B-cell type not having achieved remission: Secondary | ICD-10-CM | POA: Diagnosis not present

## 2021-10-18 DIAGNOSIS — D47Z9 Other specified neoplasms of uncertain behavior of lymphoid, hematopoietic and related tissue: Secondary | ICD-10-CM | POA: Diagnosis not present

## 2021-10-18 DIAGNOSIS — D649 Anemia, unspecified: Secondary | ICD-10-CM | POA: Diagnosis not present

## 2021-10-18 DIAGNOSIS — R768 Other specified abnormal immunological findings in serum: Secondary | ICD-10-CM | POA: Diagnosis not present

## 2021-10-24 DIAGNOSIS — C9110 Chronic lymphocytic leukemia of B-cell type not having achieved remission: Secondary | ICD-10-CM | POA: Diagnosis not present

## 2021-10-24 DIAGNOSIS — D630 Anemia in neoplastic disease: Secondary | ICD-10-CM | POA: Diagnosis not present

## 2021-10-31 DIAGNOSIS — C9110 Chronic lymphocytic leukemia of B-cell type not having achieved remission: Secondary | ICD-10-CM | POA: Diagnosis not present

## 2021-10-31 DIAGNOSIS — E1121 Type 2 diabetes mellitus with diabetic nephropathy: Secondary | ICD-10-CM | POA: Diagnosis not present

## 2021-10-31 DIAGNOSIS — E7800 Pure hypercholesterolemia, unspecified: Secondary | ICD-10-CM | POA: Diagnosis not present

## 2021-10-31 DIAGNOSIS — I129 Hypertensive chronic kidney disease with stage 1 through stage 4 chronic kidney disease, or unspecified chronic kidney disease: Secondary | ICD-10-CM | POA: Diagnosis not present

## 2021-11-06 DIAGNOSIS — Z5112 Encounter for antineoplastic immunotherapy: Secondary | ICD-10-CM | POA: Diagnosis not present

## 2021-11-06 DIAGNOSIS — C9110 Chronic lymphocytic leukemia of B-cell type not having achieved remission: Secondary | ICD-10-CM | POA: Diagnosis not present

## 2021-11-06 DIAGNOSIS — R0600 Dyspnea, unspecified: Secondary | ICD-10-CM | POA: Diagnosis not present

## 2021-11-07 DIAGNOSIS — Z5112 Encounter for antineoplastic immunotherapy: Secondary | ICD-10-CM | POA: Diagnosis not present

## 2021-11-07 DIAGNOSIS — C9110 Chronic lymphocytic leukemia of B-cell type not having achieved remission: Secondary | ICD-10-CM | POA: Diagnosis not present

## 2021-11-07 DIAGNOSIS — R222 Localized swelling, mass and lump, trunk: Secondary | ICD-10-CM | POA: Diagnosis not present

## 2021-11-08 DIAGNOSIS — C9110 Chronic lymphocytic leukemia of B-cell type not having achieved remission: Secondary | ICD-10-CM | POA: Diagnosis not present

## 2021-11-12 DIAGNOSIS — C9110 Chronic lymphocytic leukemia of B-cell type not having achieved remission: Secondary | ICD-10-CM | POA: Diagnosis not present

## 2021-11-14 DIAGNOSIS — Z5111 Encounter for antineoplastic chemotherapy: Secondary | ICD-10-CM | POA: Diagnosis not present

## 2021-11-14 DIAGNOSIS — D630 Anemia in neoplastic disease: Secondary | ICD-10-CM | POA: Diagnosis not present

## 2021-11-14 DIAGNOSIS — C9110 Chronic lymphocytic leukemia of B-cell type not having achieved remission: Secondary | ICD-10-CM | POA: Diagnosis not present

## 2021-11-14 DIAGNOSIS — Z5112 Encounter for antineoplastic immunotherapy: Secondary | ICD-10-CM | POA: Diagnosis not present

## 2021-11-19 DIAGNOSIS — C9110 Chronic lymphocytic leukemia of B-cell type not having achieved remission: Secondary | ICD-10-CM | POA: Diagnosis not present

## 2021-11-21 DIAGNOSIS — Z5112 Encounter for antineoplastic immunotherapy: Secondary | ICD-10-CM | POA: Diagnosis not present

## 2021-11-21 DIAGNOSIS — C9110 Chronic lymphocytic leukemia of B-cell type not having achieved remission: Secondary | ICD-10-CM | POA: Diagnosis not present

## 2021-11-28 DIAGNOSIS — C9110 Chronic lymphocytic leukemia of B-cell type not having achieved remission: Secondary | ICD-10-CM | POA: Diagnosis not present

## 2021-11-29 DIAGNOSIS — C9110 Chronic lymphocytic leukemia of B-cell type not having achieved remission: Secondary | ICD-10-CM | POA: Diagnosis not present

## 2021-12-05 DIAGNOSIS — Z5112 Encounter for antineoplastic immunotherapy: Secondary | ICD-10-CM | POA: Diagnosis not present

## 2021-12-05 DIAGNOSIS — C9110 Chronic lymphocytic leukemia of B-cell type not having achieved remission: Secondary | ICD-10-CM | POA: Diagnosis not present

## 2021-12-06 DIAGNOSIS — C9110 Chronic lymphocytic leukemia of B-cell type not having achieved remission: Secondary | ICD-10-CM | POA: Diagnosis not present

## 2021-12-12 DIAGNOSIS — C9110 Chronic lymphocytic leukemia of B-cell type not having achieved remission: Secondary | ICD-10-CM | POA: Diagnosis not present

## 2021-12-12 DIAGNOSIS — Z79899 Other long term (current) drug therapy: Secondary | ICD-10-CM | POA: Diagnosis not present

## 2021-12-12 DIAGNOSIS — L299 Pruritus, unspecified: Secondary | ICD-10-CM | POA: Diagnosis not present

## 2021-12-12 DIAGNOSIS — R768 Other specified abnormal immunological findings in serum: Secondary | ICD-10-CM | POA: Diagnosis not present

## 2021-12-12 DIAGNOSIS — E870 Hyperosmolality and hypernatremia: Secondary | ICD-10-CM | POA: Diagnosis not present

## 2021-12-12 DIAGNOSIS — D84821 Immunodeficiency due to drugs: Secondary | ICD-10-CM | POA: Diagnosis not present

## 2021-12-19 DIAGNOSIS — C9110 Chronic lymphocytic leukemia of B-cell type not having achieved remission: Secondary | ICD-10-CM | POA: Diagnosis not present

## 2021-12-26 DIAGNOSIS — C9110 Chronic lymphocytic leukemia of B-cell type not having achieved remission: Secondary | ICD-10-CM | POA: Diagnosis not present

## 2021-12-31 DIAGNOSIS — C9110 Chronic lymphocytic leukemia of B-cell type not having achieved remission: Secondary | ICD-10-CM | POA: Diagnosis not present

## 2022-01-02 DIAGNOSIS — R110 Nausea: Secondary | ICD-10-CM | POA: Diagnosis not present

## 2022-01-02 DIAGNOSIS — C9110 Chronic lymphocytic leukemia of B-cell type not having achieved remission: Secondary | ICD-10-CM | POA: Diagnosis not present

## 2022-01-02 DIAGNOSIS — Z5112 Encounter for antineoplastic immunotherapy: Secondary | ICD-10-CM | POA: Diagnosis not present

## 2022-01-02 DIAGNOSIS — T451X5A Adverse effect of antineoplastic and immunosuppressive drugs, initial encounter: Secondary | ICD-10-CM | POA: Diagnosis not present

## 2022-01-02 DIAGNOSIS — Z5111 Encounter for antineoplastic chemotherapy: Secondary | ICD-10-CM | POA: Diagnosis not present

## 2022-01-07 DIAGNOSIS — C9110 Chronic lymphocytic leukemia of B-cell type not having achieved remission: Secondary | ICD-10-CM | POA: Diagnosis not present

## 2022-01-09 DIAGNOSIS — T451X5A Adverse effect of antineoplastic and immunosuppressive drugs, initial encounter: Secondary | ICD-10-CM | POA: Diagnosis not present

## 2022-01-09 DIAGNOSIS — Z5111 Encounter for antineoplastic chemotherapy: Secondary | ICD-10-CM | POA: Diagnosis not present

## 2022-01-09 DIAGNOSIS — D630 Anemia in neoplastic disease: Secondary | ICD-10-CM | POA: Diagnosis not present

## 2022-01-09 DIAGNOSIS — C9110 Chronic lymphocytic leukemia of B-cell type not having achieved remission: Secondary | ICD-10-CM | POA: Diagnosis not present

## 2022-01-09 DIAGNOSIS — Z79899 Other long term (current) drug therapy: Secondary | ICD-10-CM | POA: Diagnosis not present

## 2022-01-09 DIAGNOSIS — R110 Nausea: Secondary | ICD-10-CM | POA: Diagnosis not present

## 2022-01-09 DIAGNOSIS — D84821 Immunodeficiency due to drugs: Secondary | ICD-10-CM | POA: Diagnosis not present

## 2022-01-24 DIAGNOSIS — Z7689 Persons encountering health services in other specified circumstances: Secondary | ICD-10-CM | POA: Diagnosis not present

## 2022-01-24 DIAGNOSIS — C9110 Chronic lymphocytic leukemia of B-cell type not having achieved remission: Secondary | ICD-10-CM | POA: Diagnosis not present

## 2022-01-24 DIAGNOSIS — E1159 Type 2 diabetes mellitus with other circulatory complications: Secondary | ICD-10-CM | POA: Diagnosis not present

## 2022-01-24 DIAGNOSIS — I152 Hypertension secondary to endocrine disorders: Secondary | ICD-10-CM | POA: Diagnosis not present

## 2022-01-28 DIAGNOSIS — C9110 Chronic lymphocytic leukemia of B-cell type not having achieved remission: Secondary | ICD-10-CM | POA: Diagnosis not present

## 2022-01-30 DIAGNOSIS — T451X5A Adverse effect of antineoplastic and immunosuppressive drugs, initial encounter: Secondary | ICD-10-CM | POA: Diagnosis not present

## 2022-01-30 DIAGNOSIS — D84821 Immunodeficiency due to drugs: Secondary | ICD-10-CM | POA: Diagnosis not present

## 2022-01-30 DIAGNOSIS — L299 Pruritus, unspecified: Secondary | ICD-10-CM | POA: Diagnosis not present

## 2022-01-30 DIAGNOSIS — R110 Nausea: Secondary | ICD-10-CM | POA: Diagnosis not present

## 2022-01-30 DIAGNOSIS — Z79899 Other long term (current) drug therapy: Secondary | ICD-10-CM | POA: Diagnosis not present

## 2022-01-30 DIAGNOSIS — C9110 Chronic lymphocytic leukemia of B-cell type not having achieved remission: Secondary | ICD-10-CM | POA: Diagnosis not present

## 2022-01-30 DIAGNOSIS — Z5112 Encounter for antineoplastic immunotherapy: Secondary | ICD-10-CM | POA: Diagnosis not present

## 2022-02-25 DIAGNOSIS — C9110 Chronic lymphocytic leukemia of B-cell type not having achieved remission: Secondary | ICD-10-CM | POA: Diagnosis not present

## 2022-02-27 DIAGNOSIS — L299 Pruritus, unspecified: Secondary | ICD-10-CM | POA: Diagnosis not present

## 2022-02-27 DIAGNOSIS — D84821 Immunodeficiency due to drugs: Secondary | ICD-10-CM | POA: Diagnosis not present

## 2022-02-27 DIAGNOSIS — Z5111 Encounter for antineoplastic chemotherapy: Secondary | ICD-10-CM | POA: Diagnosis not present

## 2022-02-27 DIAGNOSIS — C801 Malignant (primary) neoplasm, unspecified: Secondary | ICD-10-CM | POA: Diagnosis not present

## 2022-02-27 DIAGNOSIS — D701 Agranulocytosis secondary to cancer chemotherapy: Secondary | ICD-10-CM | POA: Diagnosis not present

## 2022-02-27 DIAGNOSIS — T451X5A Adverse effect of antineoplastic and immunosuppressive drugs, initial encounter: Secondary | ICD-10-CM | POA: Diagnosis not present

## 2022-02-27 DIAGNOSIS — C9110 Chronic lymphocytic leukemia of B-cell type not having achieved remission: Secondary | ICD-10-CM | POA: Diagnosis not present

## 2022-02-27 DIAGNOSIS — D630 Anemia in neoplastic disease: Secondary | ICD-10-CM | POA: Diagnosis not present

## 2022-02-27 DIAGNOSIS — Z79899 Other long term (current) drug therapy: Secondary | ICD-10-CM | POA: Diagnosis not present

## 2022-02-27 DIAGNOSIS — Z5112 Encounter for antineoplastic immunotherapy: Secondary | ICD-10-CM | POA: Diagnosis not present

## 2022-02-27 DIAGNOSIS — R110 Nausea: Secondary | ICD-10-CM | POA: Diagnosis not present

## 2022-03-25 DIAGNOSIS — C9110 Chronic lymphocytic leukemia of B-cell type not having achieved remission: Secondary | ICD-10-CM | POA: Diagnosis not present

## 2022-03-27 DIAGNOSIS — I152 Hypertension secondary to endocrine disorders: Secondary | ICD-10-CM | POA: Diagnosis not present

## 2022-03-27 DIAGNOSIS — R110 Nausea: Secondary | ICD-10-CM | POA: Diagnosis not present

## 2022-03-27 DIAGNOSIS — E1159 Type 2 diabetes mellitus with other circulatory complications: Secondary | ICD-10-CM | POA: Diagnosis not present

## 2022-03-27 DIAGNOSIS — C801 Malignant (primary) neoplasm, unspecified: Secondary | ICD-10-CM | POA: Diagnosis not present

## 2022-03-27 DIAGNOSIS — C9110 Chronic lymphocytic leukemia of B-cell type not having achieved remission: Secondary | ICD-10-CM | POA: Diagnosis not present

## 2022-03-27 DIAGNOSIS — Z5112 Encounter for antineoplastic immunotherapy: Secondary | ICD-10-CM | POA: Diagnosis not present

## 2022-03-27 DIAGNOSIS — D84821 Immunodeficiency due to drugs: Secondary | ICD-10-CM | POA: Diagnosis not present

## 2022-04-03 DIAGNOSIS — Z794 Long term (current) use of insulin: Secondary | ICD-10-CM | POA: Diagnosis not present

## 2022-04-03 DIAGNOSIS — Z760 Encounter for issue of repeat prescription: Secondary | ICD-10-CM | POA: Diagnosis not present

## 2022-04-03 DIAGNOSIS — E119 Type 2 diabetes mellitus without complications: Secondary | ICD-10-CM | POA: Diagnosis not present

## 2022-04-03 DIAGNOSIS — E1159 Type 2 diabetes mellitus with other circulatory complications: Secondary | ICD-10-CM | POA: Diagnosis not present

## 2022-04-03 DIAGNOSIS — I152 Hypertension secondary to endocrine disorders: Secondary | ICD-10-CM | POA: Diagnosis not present

## 2022-04-17 DIAGNOSIS — C9110 Chronic lymphocytic leukemia of B-cell type not having achieved remission: Secondary | ICD-10-CM | POA: Diagnosis not present

## 2022-04-24 DIAGNOSIS — R110 Nausea: Secondary | ICD-10-CM | POA: Diagnosis not present

## 2022-04-24 DIAGNOSIS — Z5111 Encounter for antineoplastic chemotherapy: Secondary | ICD-10-CM | POA: Diagnosis not present

## 2022-04-24 DIAGNOSIS — T451X5A Adverse effect of antineoplastic and immunosuppressive drugs, initial encounter: Secondary | ICD-10-CM | POA: Diagnosis not present

## 2022-04-24 DIAGNOSIS — E1159 Type 2 diabetes mellitus with other circulatory complications: Secondary | ICD-10-CM | POA: Diagnosis not present

## 2022-04-24 DIAGNOSIS — C801 Malignant (primary) neoplasm, unspecified: Secondary | ICD-10-CM | POA: Diagnosis not present

## 2022-04-24 DIAGNOSIS — D701 Agranulocytosis secondary to cancer chemotherapy: Secondary | ICD-10-CM | POA: Diagnosis not present

## 2022-04-24 DIAGNOSIS — I152 Hypertension secondary to endocrine disorders: Secondary | ICD-10-CM | POA: Diagnosis not present

## 2022-04-24 DIAGNOSIS — D630 Anemia in neoplastic disease: Secondary | ICD-10-CM | POA: Diagnosis not present

## 2022-04-24 DIAGNOSIS — Z79899 Other long term (current) drug therapy: Secondary | ICD-10-CM | POA: Diagnosis not present

## 2022-04-24 DIAGNOSIS — D84821 Immunodeficiency due to drugs: Secondary | ICD-10-CM | POA: Diagnosis not present

## 2022-04-24 DIAGNOSIS — L299 Pruritus, unspecified: Secondary | ICD-10-CM | POA: Diagnosis not present

## 2022-04-24 DIAGNOSIS — C9110 Chronic lymphocytic leukemia of B-cell type not having achieved remission: Secondary | ICD-10-CM | POA: Diagnosis not present

## 2022-05-20 DIAGNOSIS — C9110 Chronic lymphocytic leukemia of B-cell type not having achieved remission: Secondary | ICD-10-CM | POA: Diagnosis not present

## 2022-05-22 DIAGNOSIS — Z5111 Encounter for antineoplastic chemotherapy: Secondary | ICD-10-CM | POA: Diagnosis not present

## 2022-05-22 DIAGNOSIS — D84821 Immunodeficiency due to drugs: Secondary | ICD-10-CM | POA: Diagnosis not present

## 2022-05-22 DIAGNOSIS — C801 Malignant (primary) neoplasm, unspecified: Secondary | ICD-10-CM | POA: Diagnosis not present

## 2022-05-22 DIAGNOSIS — E1159 Type 2 diabetes mellitus with other circulatory complications: Secondary | ICD-10-CM | POA: Diagnosis not present

## 2022-05-22 DIAGNOSIS — D701 Agranulocytosis secondary to cancer chemotherapy: Secondary | ICD-10-CM | POA: Diagnosis not present

## 2022-05-22 DIAGNOSIS — L299 Pruritus, unspecified: Secondary | ICD-10-CM | POA: Diagnosis not present

## 2022-05-22 DIAGNOSIS — Z79899 Other long term (current) drug therapy: Secondary | ICD-10-CM | POA: Diagnosis not present

## 2022-05-22 DIAGNOSIS — T451X5A Adverse effect of antineoplastic and immunosuppressive drugs, initial encounter: Secondary | ICD-10-CM | POA: Diagnosis not present

## 2022-05-22 DIAGNOSIS — D630 Anemia in neoplastic disease: Secondary | ICD-10-CM | POA: Diagnosis not present

## 2022-05-22 DIAGNOSIS — I152 Hypertension secondary to endocrine disorders: Secondary | ICD-10-CM | POA: Diagnosis not present

## 2022-05-22 DIAGNOSIS — C9110 Chronic lymphocytic leukemia of B-cell type not having achieved remission: Secondary | ICD-10-CM | POA: Diagnosis not present

## 2022-05-22 DIAGNOSIS — R110 Nausea: Secondary | ICD-10-CM | POA: Diagnosis not present

## 2022-05-22 DIAGNOSIS — R21 Rash and other nonspecific skin eruption: Secondary | ICD-10-CM | POA: Diagnosis not present

## 2022-06-12 DIAGNOSIS — C9110 Chronic lymphocytic leukemia of B-cell type not having achieved remission: Secondary | ICD-10-CM | POA: Diagnosis not present

## 2022-06-19 DIAGNOSIS — Z79899 Other long term (current) drug therapy: Secondary | ICD-10-CM | POA: Diagnosis not present

## 2022-06-19 DIAGNOSIS — C9110 Chronic lymphocytic leukemia of B-cell type not having achieved remission: Secondary | ICD-10-CM | POA: Diagnosis not present

## 2022-06-19 DIAGNOSIS — R110 Nausea: Secondary | ICD-10-CM | POA: Diagnosis not present

## 2022-06-19 DIAGNOSIS — T451X5A Adverse effect of antineoplastic and immunosuppressive drugs, initial encounter: Secondary | ICD-10-CM | POA: Diagnosis not present

## 2022-06-19 DIAGNOSIS — C801 Malignant (primary) neoplasm, unspecified: Secondary | ICD-10-CM | POA: Diagnosis not present

## 2022-06-19 DIAGNOSIS — Z5111 Encounter for antineoplastic chemotherapy: Secondary | ICD-10-CM | POA: Diagnosis not present

## 2022-06-19 DIAGNOSIS — D84821 Immunodeficiency due to drugs: Secondary | ICD-10-CM | POA: Diagnosis not present

## 2022-06-19 DIAGNOSIS — E871 Hypo-osmolality and hyponatremia: Secondary | ICD-10-CM | POA: Diagnosis not present

## 2022-06-19 DIAGNOSIS — R21 Rash and other nonspecific skin eruption: Secondary | ICD-10-CM | POA: Diagnosis not present

## 2022-06-19 DIAGNOSIS — I152 Hypertension secondary to endocrine disorders: Secondary | ICD-10-CM | POA: Diagnosis not present

## 2022-06-19 DIAGNOSIS — L299 Pruritus, unspecified: Secondary | ICD-10-CM | POA: Diagnosis not present

## 2022-06-19 DIAGNOSIS — E1159 Type 2 diabetes mellitus with other circulatory complications: Secondary | ICD-10-CM | POA: Diagnosis not present

## 2022-06-19 DIAGNOSIS — D630 Anemia in neoplastic disease: Secondary | ICD-10-CM | POA: Diagnosis not present

## 2022-06-19 DIAGNOSIS — E790 Hyperuricemia without signs of inflammatory arthritis and tophaceous disease: Secondary | ICD-10-CM | POA: Diagnosis not present

## 2022-06-26 DIAGNOSIS — L2089 Other atopic dermatitis: Secondary | ICD-10-CM | POA: Diagnosis not present

## 2022-07-03 DIAGNOSIS — Z794 Long term (current) use of insulin: Secondary | ICD-10-CM | POA: Diagnosis not present

## 2022-07-03 DIAGNOSIS — E119 Type 2 diabetes mellitus without complications: Secondary | ICD-10-CM | POA: Diagnosis not present

## 2022-07-03 DIAGNOSIS — E1159 Type 2 diabetes mellitus with other circulatory complications: Secondary | ICD-10-CM | POA: Diagnosis not present

## 2022-07-03 DIAGNOSIS — I152 Hypertension secondary to endocrine disorders: Secondary | ICD-10-CM | POA: Diagnosis not present

## 2022-07-03 DIAGNOSIS — J3089 Other allergic rhinitis: Secondary | ICD-10-CM | POA: Diagnosis not present

## 2022-07-09 DIAGNOSIS — C9110 Chronic lymphocytic leukemia of B-cell type not having achieved remission: Secondary | ICD-10-CM | POA: Diagnosis not present

## 2022-07-17 DIAGNOSIS — T451X5A Adverse effect of antineoplastic and immunosuppressive drugs, initial encounter: Secondary | ICD-10-CM | POA: Diagnosis not present

## 2022-07-17 DIAGNOSIS — R110 Nausea: Secondary | ICD-10-CM | POA: Diagnosis not present

## 2022-07-17 DIAGNOSIS — D701 Agranulocytosis secondary to cancer chemotherapy: Secondary | ICD-10-CM | POA: Diagnosis not present

## 2022-07-17 DIAGNOSIS — E1159 Type 2 diabetes mellitus with other circulatory complications: Secondary | ICD-10-CM | POA: Diagnosis not present

## 2022-07-17 DIAGNOSIS — Z5111 Encounter for antineoplastic chemotherapy: Secondary | ICD-10-CM | POA: Diagnosis not present

## 2022-07-17 DIAGNOSIS — D630 Anemia in neoplastic disease: Secondary | ICD-10-CM | POA: Diagnosis not present

## 2022-07-17 DIAGNOSIS — Z79899 Other long term (current) drug therapy: Secondary | ICD-10-CM | POA: Diagnosis not present

## 2022-07-17 DIAGNOSIS — E790 Hyperuricemia without signs of inflammatory arthritis and tophaceous disease: Secondary | ICD-10-CM | POA: Diagnosis not present

## 2022-07-17 DIAGNOSIS — C9110 Chronic lymphocytic leukemia of B-cell type not having achieved remission: Secondary | ICD-10-CM | POA: Diagnosis not present

## 2022-07-17 DIAGNOSIS — L299 Pruritus, unspecified: Secondary | ICD-10-CM | POA: Diagnosis not present

## 2022-07-17 DIAGNOSIS — D84821 Immunodeficiency due to drugs: Secondary | ICD-10-CM | POA: Diagnosis not present

## 2022-07-17 DIAGNOSIS — C801 Malignant (primary) neoplasm, unspecified: Secondary | ICD-10-CM | POA: Diagnosis not present

## 2022-07-17 DIAGNOSIS — R21 Rash and other nonspecific skin eruption: Secondary | ICD-10-CM | POA: Diagnosis not present

## 2022-07-17 DIAGNOSIS — I152 Hypertension secondary to endocrine disorders: Secondary | ICD-10-CM | POA: Diagnosis not present

## 2022-07-24 DIAGNOSIS — C9110 Chronic lymphocytic leukemia of B-cell type not having achieved remission: Secondary | ICD-10-CM | POA: Diagnosis not present

## 2022-07-31 DIAGNOSIS — C9110 Chronic lymphocytic leukemia of B-cell type not having achieved remission: Secondary | ICD-10-CM | POA: Diagnosis not present

## 2022-08-07 DIAGNOSIS — C9110 Chronic lymphocytic leukemia of B-cell type not having achieved remission: Secondary | ICD-10-CM | POA: Diagnosis not present

## 2022-08-14 DIAGNOSIS — D630 Anemia in neoplastic disease: Secondary | ICD-10-CM | POA: Diagnosis not present

## 2022-08-14 DIAGNOSIS — Z79899 Other long term (current) drug therapy: Secondary | ICD-10-CM | POA: Diagnosis not present

## 2022-08-14 DIAGNOSIS — C9110 Chronic lymphocytic leukemia of B-cell type not having achieved remission: Secondary | ICD-10-CM | POA: Diagnosis not present

## 2022-08-14 DIAGNOSIS — E790 Hyperuricemia without signs of inflammatory arthritis and tophaceous disease: Secondary | ICD-10-CM | POA: Diagnosis not present

## 2022-08-14 DIAGNOSIS — R21 Rash and other nonspecific skin eruption: Secondary | ICD-10-CM | POA: Diagnosis not present

## 2022-08-14 DIAGNOSIS — C801 Malignant (primary) neoplasm, unspecified: Secondary | ICD-10-CM | POA: Diagnosis not present

## 2022-08-14 DIAGNOSIS — L299 Pruritus, unspecified: Secondary | ICD-10-CM | POA: Diagnosis not present

## 2022-08-14 DIAGNOSIS — Z5111 Encounter for antineoplastic chemotherapy: Secondary | ICD-10-CM | POA: Diagnosis not present

## 2022-08-14 DIAGNOSIS — D84821 Immunodeficiency due to drugs: Secondary | ICD-10-CM | POA: Diagnosis not present

## 2022-08-14 DIAGNOSIS — R110 Nausea: Secondary | ICD-10-CM | POA: Diagnosis not present

## 2022-08-14 DIAGNOSIS — D701 Agranulocytosis secondary to cancer chemotherapy: Secondary | ICD-10-CM | POA: Diagnosis not present

## 2022-08-14 DIAGNOSIS — T451X5A Adverse effect of antineoplastic and immunosuppressive drugs, initial encounter: Secondary | ICD-10-CM | POA: Diagnosis not present

## 2022-08-20 DIAGNOSIS — E1159 Type 2 diabetes mellitus with other circulatory complications: Secondary | ICD-10-CM | POA: Diagnosis not present

## 2022-08-20 DIAGNOSIS — C9110 Chronic lymphocytic leukemia of B-cell type not having achieved remission: Secondary | ICD-10-CM | POA: Diagnosis not present

## 2022-08-20 DIAGNOSIS — I152 Hypertension secondary to endocrine disorders: Secondary | ICD-10-CM | POA: Diagnosis not present

## 2022-08-20 DIAGNOSIS — M25561 Pain in right knee: Secondary | ICD-10-CM | POA: Diagnosis not present

## 2022-08-22 DIAGNOSIS — M25561 Pain in right knee: Secondary | ICD-10-CM | POA: Diagnosis not present

## 2022-08-22 DIAGNOSIS — E1159 Type 2 diabetes mellitus with other circulatory complications: Secondary | ICD-10-CM | POA: Diagnosis not present

## 2022-08-22 DIAGNOSIS — M1711 Unilateral primary osteoarthritis, right knee: Secondary | ICD-10-CM | POA: Diagnosis not present

## 2022-08-22 DIAGNOSIS — I152 Hypertension secondary to endocrine disorders: Secondary | ICD-10-CM | POA: Diagnosis not present

## 2022-09-04 DIAGNOSIS — C9110 Chronic lymphocytic leukemia of B-cell type not having achieved remission: Secondary | ICD-10-CM | POA: Diagnosis not present

## 2022-09-18 DIAGNOSIS — Z5111 Encounter for antineoplastic chemotherapy: Secondary | ICD-10-CM | POA: Diagnosis not present

## 2022-09-18 DIAGNOSIS — D638 Anemia in other chronic diseases classified elsewhere: Secondary | ICD-10-CM | POA: Diagnosis not present

## 2022-09-18 DIAGNOSIS — C9110 Chronic lymphocytic leukemia of B-cell type not having achieved remission: Secondary | ICD-10-CM | POA: Diagnosis not present

## 2022-09-21 IMAGING — CR DG CHEST 2V
2 series · 2 of 2 positions shown · non-contrast
Comparison: None.

CLINICAL DATA: Chest pain

EXAM:
CHEST - 2 VIEW

[chest pa]
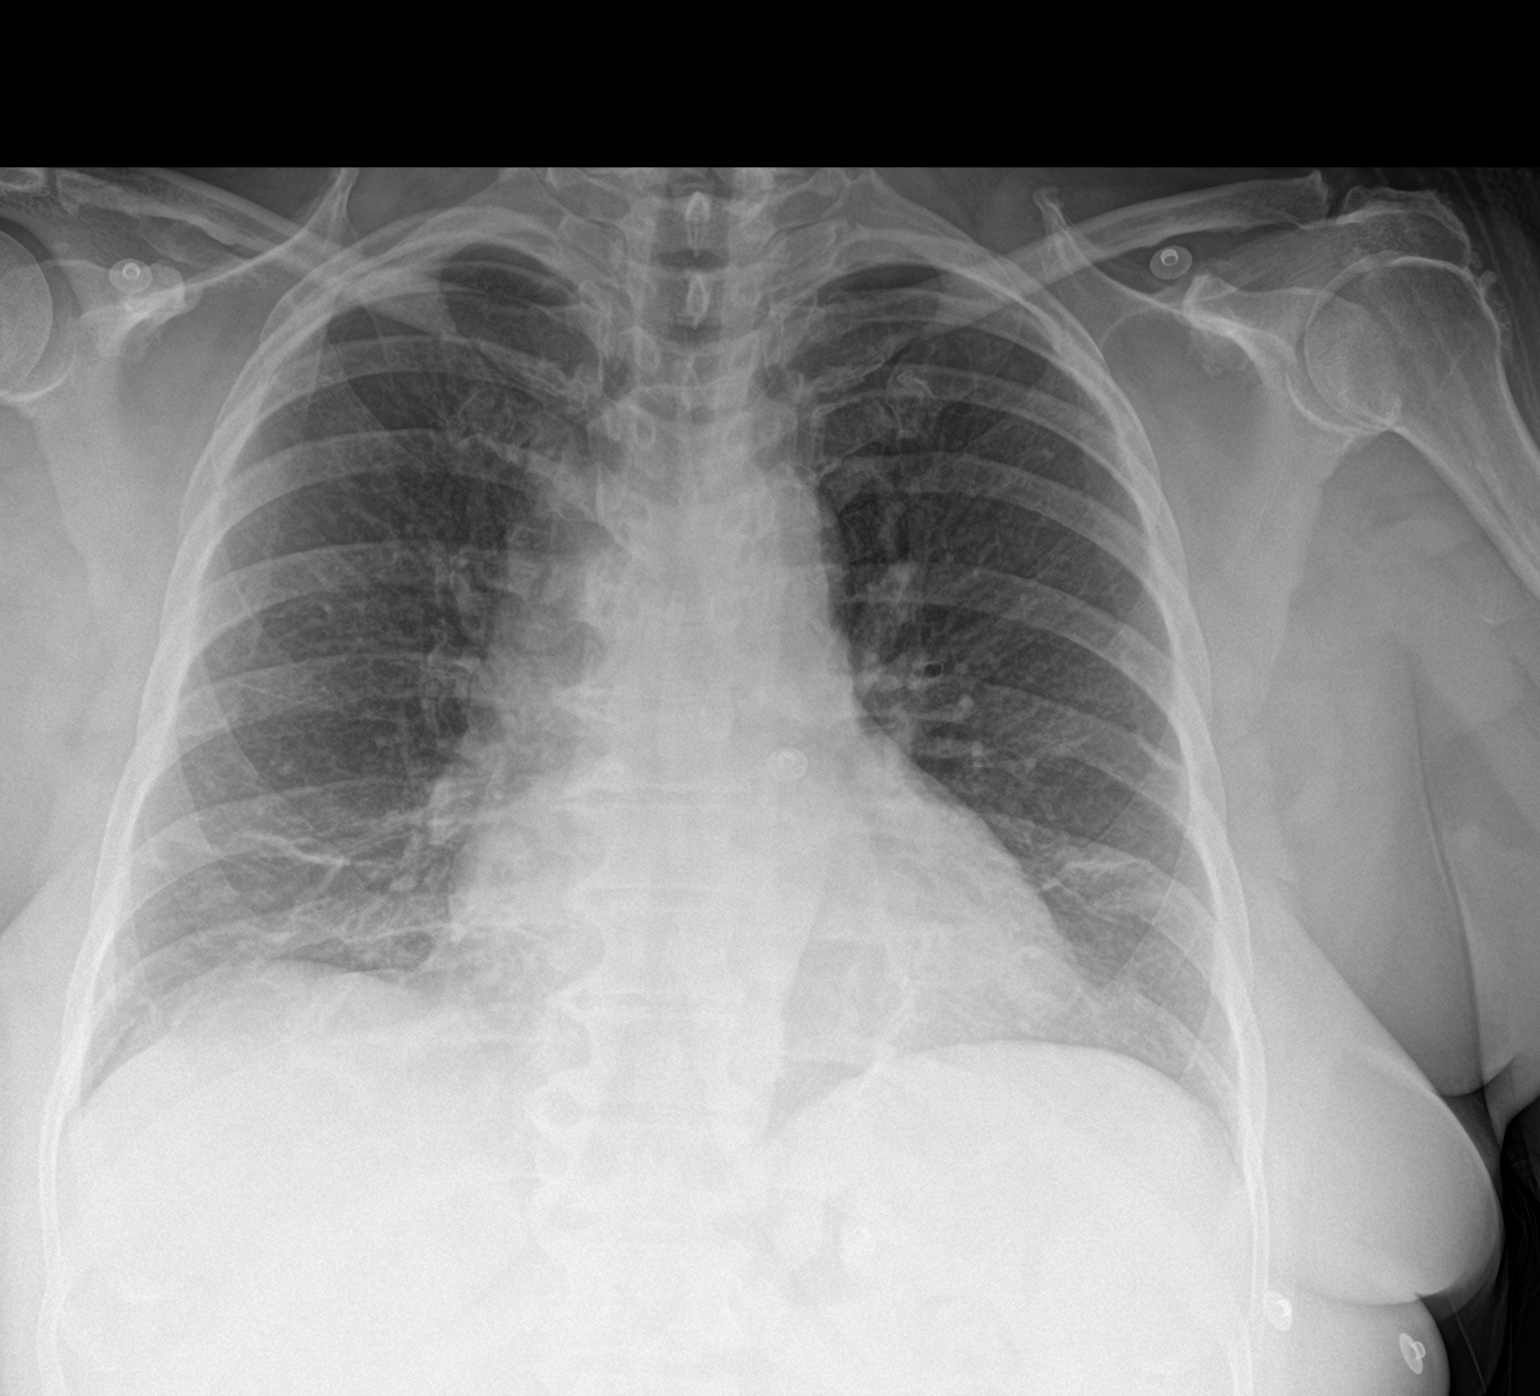

[chest lat]
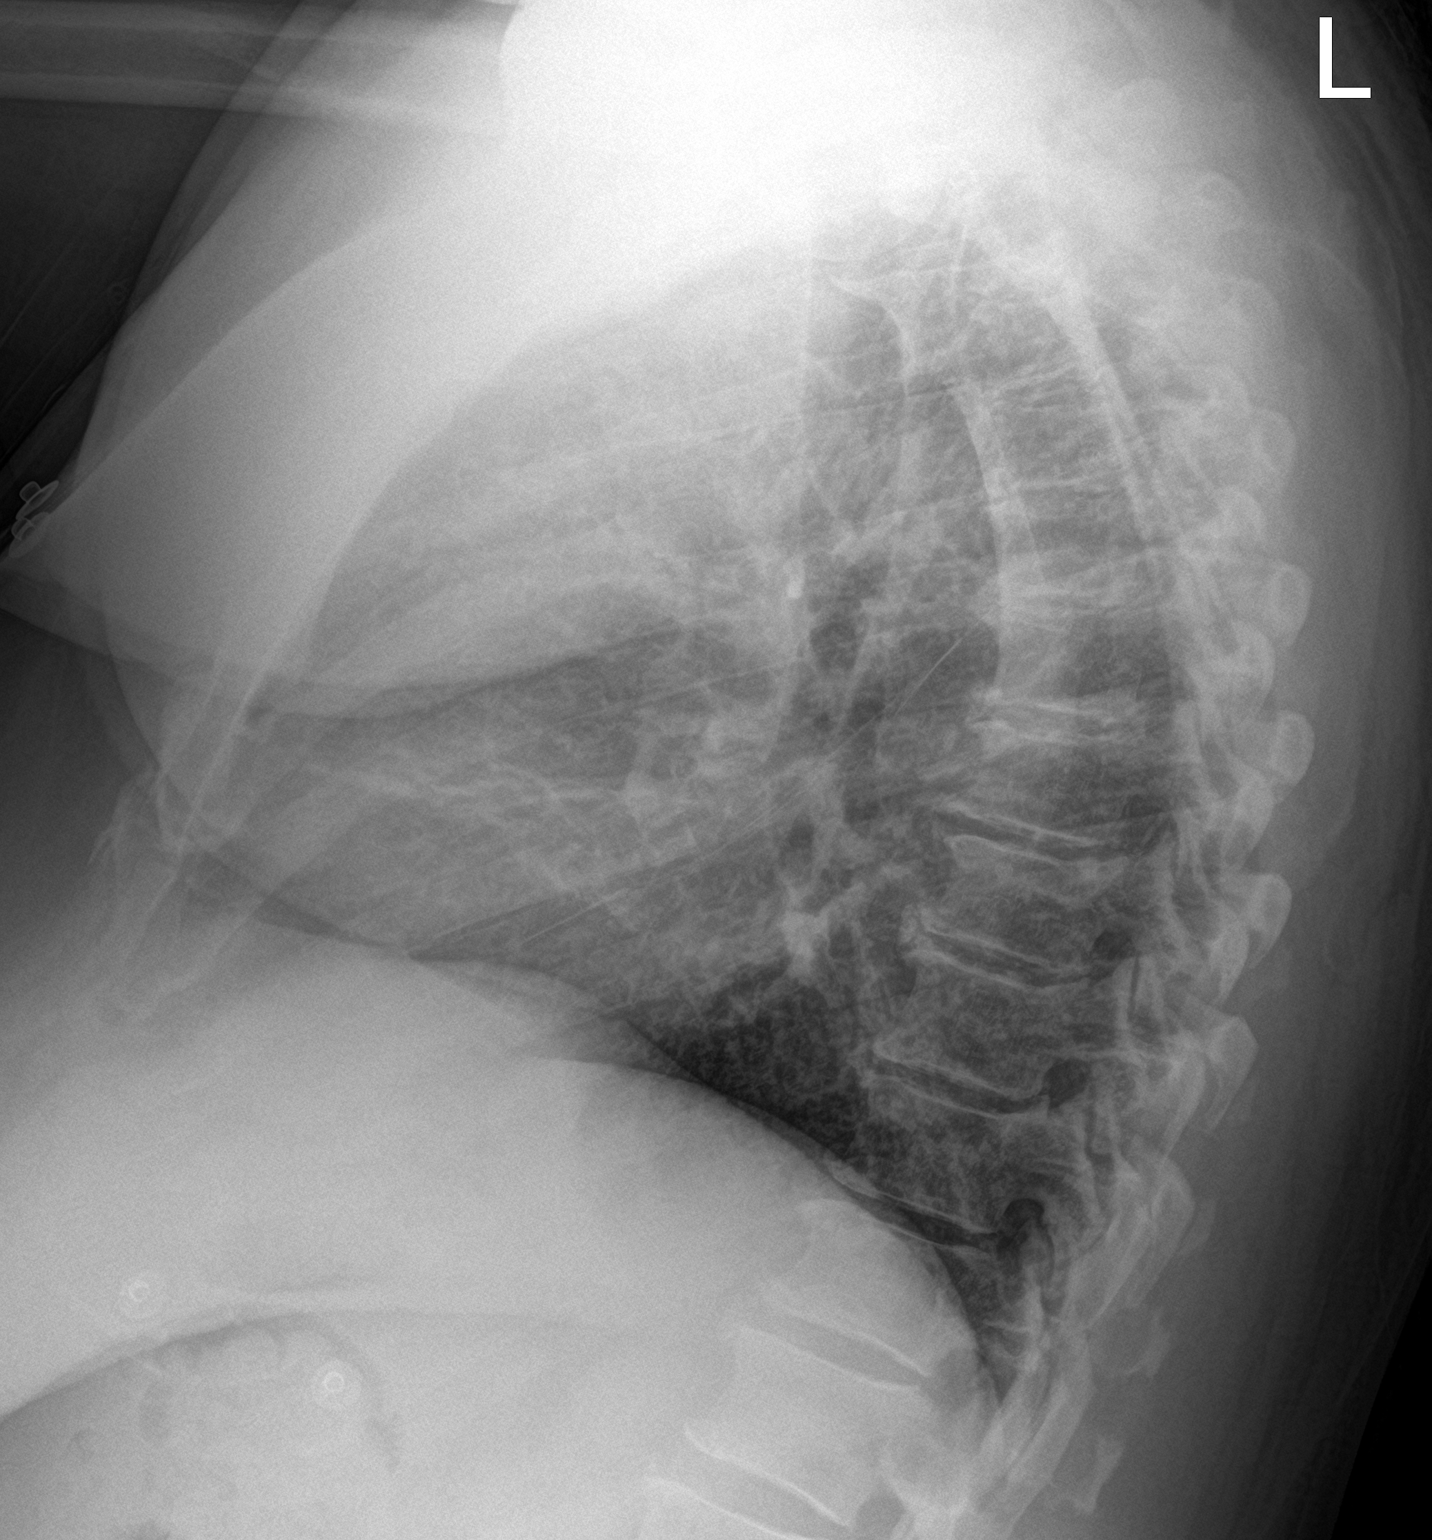

[2 of 2 positions shown; findings below may reference images not displayed]

FINDINGS: Heart size upper normal. Negative for heart failure. No infiltrate
effusion or edema. Mild linear scarring or atelectasis in the lung
bases.
IMPRESSION: Mild bibasilar atelectasis scarring. Negative for infiltrate or
effusion.

## 2022-10-02 DIAGNOSIS — Z794 Long term (current) use of insulin: Secondary | ICD-10-CM | POA: Diagnosis not present

## 2022-10-02 DIAGNOSIS — Z0000 Encounter for general adult medical examination without abnormal findings: Secondary | ICD-10-CM | POA: Diagnosis not present

## 2022-10-02 DIAGNOSIS — E1159 Type 2 diabetes mellitus with other circulatory complications: Secondary | ICD-10-CM | POA: Diagnosis not present

## 2022-10-02 DIAGNOSIS — I152 Hypertension secondary to endocrine disorders: Secondary | ICD-10-CM | POA: Diagnosis not present

## 2022-10-02 DIAGNOSIS — E119 Type 2 diabetes mellitus without complications: Secondary | ICD-10-CM | POA: Diagnosis not present

## 2022-10-23 DIAGNOSIS — C801 Malignant (primary) neoplasm, unspecified: Secondary | ICD-10-CM | POA: Diagnosis not present

## 2022-10-23 DIAGNOSIS — C9110 Chronic lymphocytic leukemia of B-cell type not having achieved remission: Secondary | ICD-10-CM | POA: Diagnosis not present

## 2022-10-23 DIAGNOSIS — Z79899 Other long term (current) drug therapy: Secondary | ICD-10-CM | POA: Diagnosis not present

## 2022-10-23 DIAGNOSIS — D84821 Immunodeficiency due to drugs: Secondary | ICD-10-CM | POA: Diagnosis not present

## 2022-10-23 DIAGNOSIS — D630 Anemia in neoplastic disease: Secondary | ICD-10-CM | POA: Diagnosis not present

## 2022-10-23 DIAGNOSIS — R110 Nausea: Secondary | ICD-10-CM | POA: Diagnosis not present

## 2022-10-23 DIAGNOSIS — T451X5A Adverse effect of antineoplastic and immunosuppressive drugs, initial encounter: Secondary | ICD-10-CM | POA: Diagnosis not present

## 2022-10-23 DIAGNOSIS — L299 Pruritus, unspecified: Secondary | ICD-10-CM | POA: Diagnosis not present

## 2022-12-04 DIAGNOSIS — L299 Pruritus, unspecified: Secondary | ICD-10-CM | POA: Diagnosis not present

## 2022-12-04 DIAGNOSIS — Z79899 Other long term (current) drug therapy: Secondary | ICD-10-CM | POA: Diagnosis not present

## 2022-12-04 DIAGNOSIS — T451X5A Adverse effect of antineoplastic and immunosuppressive drugs, initial encounter: Secondary | ICD-10-CM | POA: Diagnosis not present

## 2022-12-04 DIAGNOSIS — Z5111 Encounter for antineoplastic chemotherapy: Secondary | ICD-10-CM | POA: Diagnosis not present

## 2022-12-04 DIAGNOSIS — C801 Malignant (primary) neoplasm, unspecified: Secondary | ICD-10-CM | POA: Diagnosis not present

## 2022-12-04 DIAGNOSIS — D84821 Immunodeficiency due to drugs: Secondary | ICD-10-CM | POA: Diagnosis not present

## 2022-12-04 DIAGNOSIS — R110 Nausea: Secondary | ICD-10-CM | POA: Diagnosis not present

## 2022-12-04 DIAGNOSIS — D630 Anemia in neoplastic disease: Secondary | ICD-10-CM | POA: Diagnosis not present

## 2022-12-04 DIAGNOSIS — C9110 Chronic lymphocytic leukemia of B-cell type not having achieved remission: Secondary | ICD-10-CM | POA: Diagnosis not present

## 2023-01-01 DIAGNOSIS — E1159 Type 2 diabetes mellitus with other circulatory complications: Secondary | ICD-10-CM | POA: Diagnosis not present

## 2023-01-01 DIAGNOSIS — E119 Type 2 diabetes mellitus without complications: Secondary | ICD-10-CM | POA: Diagnosis not present

## 2023-01-01 DIAGNOSIS — Z1231 Encounter for screening mammogram for malignant neoplasm of breast: Secondary | ICD-10-CM | POA: Diagnosis not present

## 2023-01-01 DIAGNOSIS — R92323 Mammographic fibroglandular density, bilateral breasts: Secondary | ICD-10-CM | POA: Diagnosis not present

## 2023-01-01 DIAGNOSIS — G479 Sleep disorder, unspecified: Secondary | ICD-10-CM | POA: Diagnosis not present

## 2023-01-01 DIAGNOSIS — I10 Essential (primary) hypertension: Secondary | ICD-10-CM | POA: Diagnosis not present

## 2023-01-01 DIAGNOSIS — Z794 Long term (current) use of insulin: Secondary | ICD-10-CM | POA: Diagnosis not present

## 2023-01-01 DIAGNOSIS — I152 Hypertension secondary to endocrine disorders: Secondary | ICD-10-CM | POA: Diagnosis not present

## 2023-06-15 ENCOUNTER — Ambulatory Visit
Admission: EM | Admit: 2023-06-15 | Discharge: 2023-06-15 | Disposition: A | Discharge status: Home / Self Care | Attending: Nurse Practitioner | Admitting: Nurse Practitioner

## 2023-06-15 ENCOUNTER — Other Ambulatory Visit: Payer: Self-pay

## 2023-06-15 ENCOUNTER — Encounter: Payer: Self-pay | Admitting: Emergency Medicine

## 2023-06-15 DIAGNOSIS — M791 Myalgia, unspecified site: Secondary | ICD-10-CM | POA: Diagnosis not present

## 2023-06-15 LAB — POCT INFLUENZA A/B
Influenza A, POC: NEGATIVE
Influenza B, POC: NEGATIVE

## 2023-06-15 LAB — POC SARS CORONAVIRUS 2 AG -  ED: SARS Coronavirus 2 Ag: NEGATIVE

## 2023-06-15 MED ORDER — ACETAMINOPHEN 325 MG PO TABS
650.0000 mg | ORAL_TABLET | Freq: Once | ORAL | Status: AC
  Administered 2023-06-15: 650 mg via ORAL

## 2023-06-15 MED ORDER — NAPROXEN 500 MG PO TABS: 500.0000 mg | ORAL_TABLET | Freq: Two times a day (BID) | ORAL | 0 refills | Status: AC

## 2023-06-15 MED ORDER — METHOCARBAMOL 500 MG PO TABS: 500.0000 mg | ORAL_TABLET | Freq: Three times a day (TID) | ORAL | 0 refills | Status: AC

## 2023-06-15 NOTE — ED Provider Notes (Signed)
 [facility]    CSN: [id number] Arrival date & time: 06/15/23  0806      History   Chief Complaint Chief Complaint  Patient presents with   Generalized Body Aches    HPI Julie Cooke is a 68 y.o. female.   Julie Cooke is a 68 y.o. female that presents with generalized body aches and weakness that started last night. She reports difficulty sleeping and moving, with pain primarily in her arms, legs, and thighs. The onset of symptoms occurred last night when the patient was trying to get into bed. She states it took her 15 minutes to get into bed and required her husband's assistance. The patient describes feeling pain "all over" and weakness throughout her body, not localized to one side. She reports difficulty walking at her normal pace and inability to get comfortable in bed, ultimately resorting to sleeping in a chair. The patient denies any numbness or tingling sensations. Associated symptoms include low-grade fever, chills, and nausea. The patient reports feeling extremely cold last night, experiencing shivering, and requiring an extra blanket. She also woke up feeling nauseous. The patient mentions experiencing some dizziness but denies headaches, chest pain, shortness of breath, palpitations, leg swelling, abdominal pain, or urinary symptoms. She has not taken any medication for her symptoms. The patient denies any similar episodes in the past or recent illnesses such as cough, cold, or allergies. She also denies recent exposure to sick individuals. The patient reports no swollen joints, back pain, speech problems, or vision issues.  The following portions of the patient's history were reviewed and updated as appropriate: allergies, current medications, past family history, past medical history, past social history, past surgical history, and problem list.      Past Medical History:  Diagnosis Date   Diabetes mellitus without complication (HCC)    Hyperlipidemia     Hypertension    Proteinuria     There are no active problems to display for this patient.   History reviewed. No pertinent surgical history.  OB History   No obstetric history on file.      Home Medications    Prior to Admission medications   Medication Sig Start Date End Date Taking? Authorizing Provider  methocarbamol (ROBAXIN) 500 MG tablet Take 1 tablet (500 mg total) by mouth 3 (three) times daily. For muscle pain (myalgias) 06/15/23  Yes Maryruth Sol, FNP  naproxen (NAPROSYN) 500 MG tablet Take 1 tablet (500 mg total) by mouth 2 (two) times daily with a meal. 06/15/23  Yes Maryruth Sol, FNP  albuterol  (VENTOLIN  HFA) 108 (90 Base) MCG/ACT inhaler Inhale 1-2 puffs into the lungs every 6 (six) hours as needed for wheezing or shortness of breath. 01/22/20   Graham, Laura E, PA-C  benazepril (LOTENSIN) 20 MG tablet Take 20 mg by mouth daily.    [provider]  benzonatate  (TESSALON ) 100 MG capsule Take 1 capsule (100 mg total) by mouth every 8 (eight) hours. Patient not taking: Reported on 12/08/2020 01/22/20   Graham, Laura E, PA-C  ferrous fumarate (FERRO-SEQUELS) 50 MG CR tablet Take 50 mg by mouth daily.    [provider]  hydrochlorothiazide (MICROZIDE) 12.5 MG capsule Take 12.5 mg by mouth daily.    [provider]  insulin detemir (LEVEMIR) 100 UNIT/ML FlexPen Inject 54 Units into the skin at bedtime.    [provider]  metFORMIN (GLUCOPHAGE-XR) 500 MG 24 hr tablet Take 500 mg by mouth 2 (two) times daily after a meal.  [provider]  promethazine -dextromethorphan (PROMETHAZINE -DM) 6.25-15 MG/5ML syrup Take 5 mLs by mouth 4 (four) times daily as needed for cough. Patient not taking: Reported on 12/08/2020 01/22/20   Graham, Laura E, PA-C    Family History History reviewed. No pertinent family history.  Social History Social History   Tobacco Use   Smoking status: Former    Current packs/day: 0.00    Types:  Cigarettes    Quit date: 12/09/1998    Years since quitting: 24.5   Smokeless tobacco: Never  Substance Use Topics   Alcohol use: No     Allergies   Patient has no known allergies.   Review of Systems Review of Systems  Constitutional:  Positive for activity change and chills. Negative for fever.  HENT: Negative.    Eyes:  Negative for visual disturbance.  Respiratory:  Negative for shortness of breath.   Cardiovascular:  Negative for chest pain, palpitations and leg swelling.  Gastrointestinal:  Positive for nausea. Negative for abdominal pain and vomiting.  Genitourinary:  Negative for dysuria.  Musculoskeletal:  Positive for myalgias. Negative for arthralgias, back pain, gait problem and joint swelling.  Neurological:  Positive for dizziness (mild) and weakness (generalized). Negative for syncope, numbness and headaches.  Psychiatric/Behavioral:  Negative for sleep disturbance.   All other systems reviewed and are negative.    Physical Exam Triage Vital Signs ED Triage Vitals [06/15/23 0824]  Encounter Vitals Group     BP (!) 151/85     Systolic BP Percentile      Diastolic BP Percentile      Pulse Rate 91     Resp 18     Temp 99.2 F (37.3 C)     Temp Source Oral     SpO2 95 %     Weight      Height      Head Circumference      Peak Flow      Pain Score 8     Pain Loc      Pain Education      Exclude from Growth Chart    No data found.  Updated Vital Signs BP (!) 151/85 (BP Location: Left Arm)   Pulse 91   Temp 99.2 F (37.3 C) (Oral)   Resp 18   SpO2 95%   Visual Acuity Right Eye Distance:   Left Eye Distance:   Bilateral Distance:    Right Eye Near:   Left Eye Near:    Bilateral Near:     Physical Exam Vitals reviewed.  Constitutional:      General: She is awake. She is not in acute distress.    Appearance: Normal appearance. She is well-developed. She is not ill-appearing or toxic-appearing.     Comments: Uncomfortable   HENT:      Head: Normocephalic.     Mouth/Throat:     Mouth: Mucous membranes are moist.  Eyes:     Conjunctiva/sclera: Conjunctivae normal.  Cardiovascular:     Rate and Rhythm: Normal rate and regular rhythm.     Heart sounds: Normal heart sounds.  Pulmonary:     Effort: Pulmonary effort is normal.     Breath sounds: Normal breath sounds.  Musculoskeletal:        General: Normal range of motion.     Cervical back: Full passive range of motion without pain, normal range of motion and neck supple.     Comments: Diffused muscle pain without any joint pain, swelling or deformities  noted   Lymphadenopathy:     Cervical: No cervical adenopathy.  Skin:    General: Skin is warm and dry.  Neurological:     General: No focal deficit present.     Mental Status: She is alert and oriented to person, place, and time.     Sensory: Sensation is intact. No sensory deficit.     Motor: Motor function is intact.     Coordination: Coordination is intact.     Gait: Gait is intact.  Psychiatric:        Behavior: Behavior is cooperative.      UC Treatments / Results  Labs (all labs ordered are listed, but only abnormal results are displayed) Labs Reviewed  POCT INFLUENZA A/B - Normal  POC SARS CORONAVIRUS 2 AG -  ED - Normal    EKG   Radiology No results found.  Procedures Procedures (including critical care time)  Medications Ordered in UC Medications  acetaminophen (TYLENOL) tablet 650 mg (650 mg Oral Given 06/15/23 0829)    Initial Impression / Assessment and Plan / UC Course  I have reviewed the triage vital signs and the nursing notes.  Pertinent labs & imaging results that were available during my care of the patient were reviewed by me and considered in my medical decision making (see chart for details).     Patient presents with generalized body aches and weakness accompanied by low-grade fever, chills, sweats, nausea, and mild dizziness. She denies respiratory symptoms, chest  pain, or shortness of breath. No recent illness or known sick contacts reported. Vital signs are within normal limits aside from a low-grade fever of 99.81F. Physical exam reveals no focal neurological deficits, and while the patient appears uncomfortable due to widespread myalgias, she is in no acute distress. The rapid onset of symptoms with low-grade fever raises concern for a viral illness as the most likely etiology. Inflammatory or autoimmune conditions are also considered, while medication or substance-related causes are less likely given the absence of statin use and denial of drug use. Flu and COVID tests were negative today. Anti-inflammatory medication and a muscle relaxer were prescribed for symptom relief. The patient is advised to return in 48 hours for repeat flu and COVID testing if symptoms do not improve. If repeat tests remain negative, blood work may be necessary for further evaluation. Emergency precautions and follow-up instructions were reviewed thoroughly with the patient and her daughter.  Today's evaluation has revealed no signs of a dangerous process. Discussed diagnosis with patient and/or guardian. Patient and/or guardian aware of their diagnosis, possible red flag symptoms to watch out for and need for close follow up. Patient and/or guardian understands verbal and written discharge instructions. Patient and/or guardian comfortable with plan and disposition.  Patient and/or guardian has a clear mental status at this time, good insight into illness (after discussion and teaching) and has clear judgment to make decisions regarding their care  Documentation was completed with the aid of voice recognition software. Transcription may contain typographical errors.  Final Clinical Impressions(s) / UC Diagnoses   Final diagnoses:  Myalgia     Discharge Instructions      You were seen today for body aches, weakness, low-grade fever, chills, nausea, and dizziness. Your symptoms  are most likely due to a viral illness, but other causes may be considered if symptoms do not improve. Your flu and COVID tests were negative today. You were prescribed an anti-inflammatory and a muscle relaxer to help with the pain and  discomfort. It's important to rest, drink plenty of fluids, and take the medications as directed. Take your medications exactly as prescribed. Do not take any over-the-counter aspirin, Motrin, ibuprofen, or Aleve while using the medications you were given. If your symptoms do not improve in the next 48 hours, please return for repeat flu and COVID testing. If those tests are still negative, we may need to do additional blood work to look for other causes. Go to the emergency department if you develop a high fever, difficulty breathing, chest pain, confusion, or if your symptoms suddenly get worse.    ED Prescriptions     Medication Sig Dispense Auth. Provider   naproxen (NAPROSYN) 500 MG tablet Take 1 tablet (500 mg total) by mouth 2 (two) times daily with a meal. 20 tablet Maryruth Sol, FNP   methocarbamol (ROBAXIN) 500 MG tablet Take 1 tablet (500 mg total) by mouth 3 (three) times daily. For muscle pain (myalgias) 21 tablet Maryruth Sol, FNP      PDMP not reviewed this encounter.   Beola Brazil [place], [place] 06/15/23 [phone]

## 2023-06-15 NOTE — Discharge Instructions (Addendum)
 You were seen today for body aches, weakness, low-grade fever, chills, nausea, and dizziness. Your symptoms are most likely due to a viral illness, but other causes may be considered if symptoms do not improve. Your flu and COVID tests were negative today. You were prescribed an anti-inflammatory and a muscle relaxer to help with the pain and discomfort. It's important to rest, drink plenty of fluids, and take the medications as directed. Take your medications exactly as prescribed. Do not take any over-the-counter aspirin, Motrin, ibuprofen, or Aleve while using the medications you were given. If your symptoms do not improve in the next 48 hours, please return for repeat flu and COVID testing. If those tests are still negative, we may need to do additional blood work to look for other causes. Go to the emergency department if you develop a high fever, difficulty breathing, chest pain, confusion, or if your symptoms suddenly get worse.

## 2023-06-15 NOTE — ED Triage Notes (Signed)
 Pt sts started having body aches and pain in arms and legs starting last night; sts some nausea currently but denies other sx
